# Patient Record
Sex: Male | Born: 1950
Health system: Southern US, Community
[De-identification: ages and names within clinical notes are randomized; demographics above are authoritative.]

## PROBLEM LIST (undated history)

## (undated) DIAGNOSIS — E559 Vitamin D deficiency, unspecified: Secondary | ICD-10-CM

## (undated) DIAGNOSIS — R635 Abnormal weight gain: Secondary | ICD-10-CM

## (undated) DIAGNOSIS — E7800 Pure hypercholesterolemia, unspecified: Secondary | ICD-10-CM

## (undated) DIAGNOSIS — Z79899 Other long term (current) drug therapy: Secondary | ICD-10-CM

## (undated) DIAGNOSIS — K219 Gastro-esophageal reflux disease without esophagitis: Secondary | ICD-10-CM

## (undated) DIAGNOSIS — I2510 Atherosclerotic heart disease of native coronary artery without angina pectoris: Secondary | ICD-10-CM

## (undated) DIAGNOSIS — I10 Essential (primary) hypertension: Secondary | ICD-10-CM

## (undated) DIAGNOSIS — S060XAA Concussion with loss of consciousness status unknown, initial encounter: Secondary | ICD-10-CM

## (undated) DIAGNOSIS — R011 Cardiac murmur, unspecified: Secondary | ICD-10-CM

## (undated) DIAGNOSIS — Z22322 Carrier or suspected carrier of Methicillin resistant Staphylococcus aureus: Secondary | ICD-10-CM

## (undated) DIAGNOSIS — E782 Mixed hyperlipidemia: Secondary | ICD-10-CM

## (undated) DIAGNOSIS — M1990 Unspecified osteoarthritis, unspecified site: Secondary | ICD-10-CM

## (undated) DIAGNOSIS — S060X9A Concussion with loss of consciousness of unspecified duration, initial encounter: Secondary | ICD-10-CM

## (undated) HISTORY — DX: Essential (primary) hypertension: I10

## (undated) HISTORY — PX: COLONOSCOPY W/ POLYPECTOMY: SHX1380

## (undated) HISTORY — DX: Mixed hyperlipidemia: E78.2

## (undated) HISTORY — DX: Other long term (current) drug therapy: Z79.899

## (undated) HISTORY — DX: Vitamin D deficiency, unspecified: E55.9

## (undated) HISTORY — DX: Pure hypercholesterolemia, unspecified: E78.00

## (undated) HISTORY — DX: Abnormal weight gain: R63.5

---
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
## 2000-07-27 HISTORY — PX: TOTAL HIP ARTHROPLASTY: SHX124

## 2010-07-27 HISTORY — PX: RHINOPLASTY: SUR1284

## 2011-07-28 HISTORY — PX: LAMINECTOMY: SHX219

## 2018-09-09 DIAGNOSIS — Z22322 Carrier or suspected carrier of Methicillin resistant Staphylococcus aureus: Secondary | ICD-10-CM

## 2018-09-09 HISTORY — DX: Carrier or suspected carrier of methicillin resistant Staphylococcus aureus: Z22.322

## 2018-09-25 HISTORY — PX: LAMINECTOMY: SHX219

## 2019-01-10 ENCOUNTER — Encounter: Payer: Self-pay | Admitting: Cardiovascular Disease

## 2019-04-02 ENCOUNTER — Encounter: Payer: Self-pay | Admitting: Cardiovascular Disease

## 2019-04-21 ENCOUNTER — Other Ambulatory Visit: Payer: Self-pay

## 2019-04-21 ENCOUNTER — Encounter: Payer: Self-pay | Admitting: Cardiovascular Disease

## 2019-04-21 ENCOUNTER — Ambulatory Visit (INDEPENDENT_AMBULATORY_CARE_PROVIDER_SITE_OTHER): Payer: BC Managed Care – PPO | Admitting: Cardiovascular Disease

## 2019-04-21 DIAGNOSIS — R9439 Abnormal result of other cardiovascular function study: Secondary | ICD-10-CM | POA: Diagnosis not present

## 2019-04-21 DIAGNOSIS — I10 Essential (primary) hypertension: Secondary | ICD-10-CM | POA: Diagnosis not present

## 2019-04-21 DIAGNOSIS — E782 Mixed hyperlipidemia: Secondary | ICD-10-CM

## 2019-04-21 DIAGNOSIS — E785 Hyperlipidemia, unspecified: Secondary | ICD-10-CM | POA: Insufficient documentation

## 2019-04-21 LAB — CBC
Hematocrit: 46.1 % (ref 37.5–51.0)
Hemoglobin: 15.6 g/dL (ref 13.0–17.7)
MCH: 30.6 pg (ref 26.6–33.0)
MCHC: 33.8 g/dL (ref 31.5–35.7)
MCV: 91 fL (ref 79–97)
Platelets: 240 10*3/uL (ref 150–450)
RBC: 5.09 x10E6/uL (ref 4.14–5.80)
RDW: 13.3 % (ref 11.6–15.4)
WBC: 6.6 10*3/uL (ref 3.4–10.8)

## 2019-04-21 LAB — BASIC METABOLIC PANEL
BUN/Creatinine Ratio: 16 (ref 10–24)
BUN: 19 mg/dL (ref 8–27)
CO2: 24 mmol/L (ref 20–29)
Calcium: 9.5 mg/dL (ref 8.6–10.2)
Chloride: 101 mmol/L (ref 96–106)
Creatinine, Ser: 1.16 mg/dL (ref 0.76–1.27)
GFR calc Af Amer: 75 mL/min/{1.73_m2} (ref >59)
GFR calc non Af Amer: 65 mL/min/{1.73_m2} (ref >59)
Glucose: 101 mg/dL — ABNORMAL HIGH (ref 65–99)
Potassium: 4.8 mmol/L (ref 3.5–5.2)
Sodium: 138 mmol/L (ref 134–144)

## 2019-04-21 LAB — TSH: TSH: 1.38 u[IU]/mL (ref 0.450–4.500)

## 2019-04-21 NOTE — Assessment & Plan Note (Signed)
History of hyperlipidemia on statin therapy with recent lipid profile performed by his PCP revealing total cholesterol 214, LDL 126 and HDL 47.

## 2019-04-21 NOTE — Patient Instructions (Addendum)
    [address] [place] [address] Dept: [phone] Loc: [place]  04/21/2019  You are scheduled for a Cardiac Catheterization on Monday, October 5 with Dr. Quay Burow.  1. Please arrive at the [facility] (Main Entrance A) at [facility]: [address] [place], [place] [address] at 11:30 AM (This time is two hours before your procedure to ensure your preparation). Free valet parking service is available.   Special note: Every effort is made to have your procedure done on time. Please understand that emergencies sometimes delay scheduled procedures.  2. Diet: Do not eat solid foods after midnight.  The patient may have clear liquids until 5am upon the day of the procedure.  3. Labs:  You will need to have blood drawn today. Go to:  [facility] at [facility] [address], [place], [place] [address] FOR YOUR BASIC METABOLIC PANEL, COMPLETE BLOOD COUNT, AND THYROID STIMULATING HORMONE LAB WORK. NO APPOINTMENT IS NEEDED. YOU MUST HAVE THIS LAB WORK DONE BEFORE GOING TO GET YOUR COVID-19 TEST DONE BECAUSE YOU WILL NEED TO SELF-ISOLATE AFTER THE COVID-19 TEST UNTIL THE DAY OF YOUR [place].  You will need a COVID-19 on 04/27/2019. Your appointment is at 1:00pm. Go to: [facility] Entr[facility] [place],  [address] FOR YOUR COVID-19 TEST. YOU MUST HAVE YOUR COVID-19 TEST COMPLETED 4 DAYS PRIOR TO YOUR UPCOMING PROCEDURE/TEST. YOU WILL ALSO NEED TO SELF-ISOLATE AFTER THE COVID-19 TEST UNTIL THE DAY OF YOUR PROCEDURE/TEST. PLEASE BRING YOUR I.D. AND YOUR INSURANCE CARD(S) WITH YOU.   4. Medication instructions in preparation for your procedure:   On the morning of your procedure, take your Aspirin and any morning medicines NOT listed above.  You may use sips of water.  5. Plan for one night  stay--bring personal belongings. 6. Bring a current list of your medications and current insurance cards. 7. You MUST have a responsible person to drive you home. 8. Someone MUST be with you the first 24 hours after you arrive home or your discharge will be delayed. 9. Please wear clothes that are easy to get on and off and wear slip-on shoes.  _____________________________________________________________________  Follow-Up: At [facility], you and your health needs are our priority.  As part of our continuing mission to provide you with exceptional heart care, we have created designated Provider Care Teams.  These Care Teams include your primary Cardiologist (physician) and Advanced Practice Providers (APPs -  Physician Assistants and Nurse Practitioners) who all work together to provide you with the care you need, when you need it. You will need a follow up appointment with Dr. Quay Burow after your pricedure. To be scheduled

## 2019-04-21 NOTE — Assessment & Plan Note (Signed)
Donald Hendrix is a 68 year old moderately overweight married Caucasian male sent to me by Dr. Claudie Leach for diagnostic coronary angiography because of chest pain and it positive Myoview stress test.  He has had exertional chest pain for years which is become more noticeable recently with long recovery time.  He had a Myoview stress test performed 03/30/2019 revealing inferior and apical ischemia.  2D echo was essentially normal with mild aortic sclerosis.  We will arrange for him to undergo left heart cath via the right radial approach.   I have reviewed the risks, indications, and alternatives to cardiac catheterization, possible angioplasty, and stenting with the patient. Risks include but are not limited to bleeding, infection, vascular injury, stroke, myocardial infection, arrhythmia, kidney injury, radiation-related injury in the case of prolonged fluoroscopy use, emergency cardiac surgery, and death. The patient understands the risks of serious complication is 1-2 in 1191 with diagnostic cardiac cath and 1-2% or less with angioplasty/stenting.

## 2019-04-21 NOTE — Assessment & Plan Note (Addendum)
History of essential hypertension with blood pressure measured today at 140/80.  He is on metoprolol.

## 2019-04-21 NOTE — H&P (View-Only) (Signed)
04/21/2019 Donald Hendrix   1950-08-26  [id number]  Primary Physician Franchot Mimes, MD Primary Cardiologist: Lorretta Harp MD [name], [place]  HPI:  Donald Hendrix is a 68 y.o. 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  He was referred by Dr. Claudie Leach for left heart cath because of chest pain and an abnormal Myoview stress test.  He does have a history of treated hypertension hyperlipidemia.  He had a 2D echo cardiogram performed 01/12/2019 that was essentially normal with aortic sclerosis and a Myoview stress test performed 03/30/2019 that showed inferior and apical ischemia.  He is had several years of exertional chest pain especially while mowing his lawn which is taken longer to resolve recently.   Current Meds  Medication Sig  . aspirin EC 81 MG tablet Take 81 mg by mouth daily.  . colchicine 0.6 MG tablet Take 0.6 mg by mouth as needed.   Marland Kitchen esomeprazole (NEXIUM) 40 MG capsule Take 40 mg by mouth daily at 12 noon.  . etodolac (LODINE) 400 MG tablet Take 400 mg by mouth 2 (two) times daily.  . fluticasone (FLONASE) 50 MCG/ACT nasal spray Place 2 sprays into both nostrils daily.  . metoprolol succinate (TOPROL-XL) 25 MG 24 hr tablet Take 25 mg by mouth daily.  . metoprolol succinate (TOPROL-XL) 50 MG 24 hr tablet Take 50 mg by mouth daily. Take with or immediately following a meal.  . Multiple Vitamin (MULTI-VITAMIN DAILY PO) Take 1 tablet by mouth daily.  . nitroGLYCERIN (NITROSTAT) 0.4 MG SL tablet Place 0.4 mg under the tongue every 5 (five) minutes as needed for chest pain.  . Omega-3 Fatty Acids (FISH OIL) 1000 MG CAPS Take 1 capsule by mouth daily.  Marland Kitchen PARoxetine (PAXIL) 10 MG tablet Take 10 mg by mouth daily.  . pravastatin (PRAVACHOL) 20 MG tablet Take 20 mg by mouth daily.     Allergies  Allergen Reactions  . Cephalexin Hives  . Dexamethasone Sodium Phosphate   . Bactrim  [Sulfamethoxazole-Trimethoprim] Rash    Social History   Socioeconomic History  . Marital status: Married    Spouse name: Not on file  . Number of children: Not on file  . Years of education: Not on file  . Highest education level: Not on file  Occupational History  . Not on file  Social Needs  . Financial resource strain: Not on file  . Food insecurity    Worry: Not on file    Inability: Not on file  . Transportation needs    Medical: Not on file    Non-medical: Not on file  Tobacco Use  . Smoking status: Never Smoker  . Smokeless tobacco: Never Used  Substance and Sexual Activity  . Alcohol use: Not on file  . Drug use: Not on file  . Sexual activity: Not on file  Lifestyle  . Physical activity    Days per week: Not on file    Minutes per session: Not on file  . Stress: Not on file  Relationships  . Social Herbalist on phone: Not on file    Gets together: Not on file    Attends religious service: Not on file    Active member of club or organization: Not on file    Attends meetings of clubs or organizations: Not on file    Relationship status: Not on file  .  Intimate partner violence    Fear of current or ex partner: Not on file    Emotionally abused: Not on file    Physically abused: Not on file    Forced sexual activity: Not on file  Other Topics Concern  . Not on file  Social History Narrative  . Not on file     Review of Systems: General: negative for chills, fever, night sweats or weight changes.  Cardiovascular: negative for chest pain, dyspnea on exertion, edema, orthopnea, palpitations, paroxysmal nocturnal dyspnea or shortness of breath Dermatological: negative for rash Respiratory: negative for cough or wheezing Urologic: negative for hematuria Abdominal: negative for nausea, vomiting, diarrhea, bright red blood per rectum, melena, or hematemesis Neurologic: negative for visual changes, syncope, or dizziness All other systems reviewed  and are otherwise negative except as noted above.    Blood pressure 140/80, pulse 66, height 6' (1.829 m), weight 255 lb (115.7 kg).  General appearance: alert and no distress Neck: no adenopathy, no carotid bruit, no JVD, supple, symmetrical, trachea midline and thyroid not enlarged, symmetric, no tenderness/mass/nodules Lungs: clear to auscultation bilaterally Heart: Soft outflow tract murmur Extremities: extremities normal, atraumatic, no cyanosis or edema Pulses: 2+ and symmetric Skin: Skin color, texture, turgor normal. No rashes or lesions Neurologic: Alert and oriented X 3, normal strength and tone. Normal symmetric reflexes. Normal coordination and gait  EKG sinus rhythm at 66 without ST or T wave changes.  I personally reviewed this EKG.  ASSESSMENT AND PLAN:   Abnormal nuclear stress test Donald Hendrix is a 68-year-old moderately overweight married Caucasian male sent to me by Dr. Rosario for diagnostic coronary angiography because of chest pain and it positive Myoview stress test.  He has had exertional chest pain for years which is become more noticeable recently with long recovery time.  He had a Myoview stress test performed 03/30/2019 revealing inferior and apical ischemia.  2D echo was essentially normal with mild aortic sclerosis.  We will arrange for him to undergo left heart cath via the right radial approach.   I have reviewed the risks, indications, and alternatives to cardiac catheterization, possible angioplasty, and stenting with the patient. Risks include but are not limited to bleeding, infection, vascular injury, stroke, myocardial infection, arrhythmia, kidney injury, radiation-related injury in the case of prolonged fluoroscopy use, emergency cardiac surgery, and death. The patient understands the risks of serious complication is 1-2 in 1000 with diagnostic cardiac cath and 1-2% or less with angioplasty/stenting.   Hyperlipidemia History of hyperlipidemia on statin  therapy with recent lipid profile performed by his PCP revealing total cholesterol 214, LDL 126 and HDL 47.  Essential hypertension History of essential hypertension with blood pressure measured today at.  He is on metoprolol.      Keilyn Nadal J. Chinaza Rooke MD FACP,FACC,FAHA, FSCAI 04/21/2019 11:24 AM 

## 2019-04-21 NOTE — Progress Notes (Signed)
04/21/2019 Donald Hendrix   1950-08-26  [id number]  Primary Physician Franchot Mimes, MD Primary Cardiologist: Lorretta Harp MD [name], [place]  HPI:  Donald Hendrix is a 68 y.o. 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  He was referred by Dr. Claudie Leach for left heart cath because of chest pain and an abnormal Myoview stress test.  He does have a history of treated hypertension hyperlipidemia.  He had a 2D echo cardiogram performed 01/12/2019 that was essentially normal with aortic sclerosis and a Myoview stress test performed 03/30/2019 that showed inferior and apical ischemia.  He is had several years of exertional chest pain especially while mowing his lawn which is taken longer to resolve recently.   Current Meds  Medication Sig  . aspirin EC 81 MG tablet Take 81 mg by mouth daily.  . colchicine 0.6 MG tablet Take 0.6 mg by mouth as needed.   Marland Kitchen esomeprazole (NEXIUM) 40 MG capsule Take 40 mg by mouth daily at 12 noon.  . etodolac (LODINE) 400 MG tablet Take 400 mg by mouth 2 (two) times daily.  . fluticasone (FLONASE) 50 MCG/ACT nasal spray Place 2 sprays into both nostrils daily.  . metoprolol succinate (TOPROL-XL) 25 MG 24 hr tablet Take 25 mg by mouth daily.  . metoprolol succinate (TOPROL-XL) 50 MG 24 hr tablet Take 50 mg by mouth daily. Take with or immediately following a meal.  . Multiple Vitamin (MULTI-VITAMIN DAILY PO) Take 1 tablet by mouth daily.  . nitroGLYCERIN (NITROSTAT) 0.4 MG SL tablet Place 0.4 mg under the tongue every 5 (five) minutes as needed for chest pain.  . Omega-3 Fatty Acids (FISH OIL) 1000 MG CAPS Take 1 capsule by mouth daily.  Marland Kitchen PARoxetine (PAXIL) 10 MG tablet Take 10 mg by mouth daily.  . pravastatin (PRAVACHOL) 20 MG tablet Take 20 mg by mouth daily.     Allergies  Allergen Reactions  . Cephalexin Hives  . Dexamethasone Sodium Phosphate   . Bactrim  [Sulfamethoxazole-Trimethoprim] Rash    Social History   Socioeconomic History  . Marital status: Married    Spouse name: Not on file  . Number of children: Not on file  . Years of education: Not on file  . Highest education level: Not on file  Occupational History  . Not on file  Social Needs  . Financial resource strain: Not on file  . Food insecurity    Worry: Not on file    Inability: Not on file  . Transportation needs    Medical: Not on file    Non-medical: Not on file  Tobacco Use  . Smoking status: Never Smoker  . Smokeless tobacco: Never Used  Substance and Sexual Activity  . Alcohol use: Not on file  . Drug use: Not on file  . Sexual activity: Not on file  Lifestyle  . Physical activity    Days per week: Not on file    Minutes per session: Not on file  . Stress: Not on file  Relationships  . Social Herbalist on phone: Not on file    Gets together: Not on file    Attends religious service: Not on file    Active member of club or organization: Not on file    Attends meetings of clubs or organizations: Not on file    Relationship status: Not on file  .  Intimate partner violence    Fear of current or ex partner: Not on file    Emotionally abused: Not on file    Physically abused: Not on file    Forced sexual activity: Not on file  Other Topics Concern  . Not on file  Social History Narrative  . Not on file     Review of Systems: General: negative for chills, fever, night sweats or weight changes.  Cardiovascular: negative for chest pain, dyspnea on exertion, edema, orthopnea, palpitations, paroxysmal nocturnal dyspnea or shortness of breath Dermatological: negative for rash Respiratory: negative for cough or wheezing Urologic: negative for hematuria Abdominal: negative for nausea, vomiting, diarrhea, bright red blood per rectum, melena, or hematemesis Neurologic: negative for visual changes, syncope, or dizziness All other systems reviewed  and are otherwise negative except as noted above.    Blood pressure 140/80, pulse 66, height 6' (1.829 m), weight 255 lb (115.7 kg).  General appearance: alert and no distress Neck: no adenopathy, no carotid bruit, no JVD, supple, symmetrical, trachea midline and thyroid not enlarged, symmetric, no tenderness/mass/nodules Lungs: clear to auscultation bilaterally Heart: Soft outflow tract murmur Extremities: extremities normal, atraumatic, no cyanosis or edema Pulses: 2+ and symmetric Skin: Skin color, texture, turgor normal. No rashes or lesions Neurologic: Alert and oriented X 3, normal strength and tone. Normal symmetric reflexes. Normal coordination and gait  EKG sinus rhythm at 66 without ST or T wave changes.  I personally reviewed this EKG.  ASSESSMENT AND PLAN:   Abnormal nuclear stress test Donald Hendrix is a 68 year old moderately overweight married Caucasian male sent to me by Dr. Hanley Hays for diagnostic coronary angiography because of chest pain and it positive Myoview stress test.  He has had exertional chest pain for years which is become more noticeable recently with long recovery time.  He had a Myoview stress test performed 03/30/2019 revealing inferior and apical ischemia.  2D echo was essentially normal with mild aortic sclerosis.  We will arrange for him to undergo left heart cath via the right radial approach.   I have reviewed the risks, indications, and alternatives to cardiac catheterization, possible angioplasty, and stenting with the patient. Risks include but are not limited to bleeding, infection, vascular injury, stroke, myocardial infection, arrhythmia, kidney injury, radiation-related injury in the case of prolonged fluoroscopy use, emergency cardiac surgery, and death. The patient understands the risks of serious complication is 1-2 in 1000 with diagnostic cardiac cath and 1-2% or less with angioplasty/stenting.   Hyperlipidemia History of hyperlipidemia on statin  therapy with recent lipid profile performed by his PCP revealing total cholesterol 214, LDL 126 and HDL 47.  Essential hypertension History of essential hypertension with blood pressure measured today at.  He is on metoprolol.      Runell Gess MD FACP,FACC,FAHA, [facility] 04/21/2019 11:24 AM

## 2019-04-24 ENCOUNTER — Encounter: Payer: Self-pay | Admitting: *Deleted

## 2019-04-27 ENCOUNTER — Inpatient Hospital Stay (HOSPITAL_COMMUNITY): Admission: RE | Admit: 2019-04-27 | Payer: BC Managed Care – PPO | Source: Ambulatory Visit

## 2019-04-27 ENCOUNTER — Telehealth: Payer: Self-pay | Admitting: *Deleted

## 2019-04-27 ENCOUNTER — Other Ambulatory Visit: Payer: Self-pay | Admitting: Emergency Medicine

## 2019-04-27 DIAGNOSIS — Z20822 Contact with and (suspected) exposure to covid-19: Secondary | ICD-10-CM

## 2019-04-27 NOTE — Telephone Encounter (Addendum)
Pt contacted pre-catheterization scheduled at [facility] for: Monday May 01, 2019 1:30 PM Verified arrival time and place: [facility]) at: 11:30 AM   No solid food after midnight prior to cath, clear liquids until 5 AM day of procedure. Contrast allergy: no   AM meds can be  taken pre-cath with sip of water including: ASA 81 mg   Confirmed patient has responsible person to drive home post procedure and observe 24 hours after arriving home: yes  Currently, due to Covid-19 pandemic, only one support person will be allowed with patient. Must be the same support person for that patient's entire stay, will be screened and required to wear a mask. They will be asked to wait in the waiting room for the duration of the patient's stay.  Patients are required to wear a mask when they enter the hospital.      COVID-19 Pre-Screening Questions:  . In the past 7 to 10 days have you had a cough,  shortness of breath, headache, congestion, fever (100 or greater) body aches, chills, sore throat, or sudden loss of taste or sense of smell? no . Have you been around anyone with known Covid 19? no . Have you been around anyone who is awaiting Covid 19 test results in the past 7 to 10 days? no . Have you been around anyone who has been exposed to Covid 19, or has mentioned symptoms of Covid 19 within the past 7 to 10 days? no  I reviewed procedure/mask/visitor instructions, Covid-19 screening questions with patient, he verbalized understanding, thanked me for call.

## 2019-04-28 LAB — NOVEL CORONAVIRUS, NAA: SARS-CoV-2, NAA: NOT DETECTED

## 2019-05-01 ENCOUNTER — Ambulatory Visit (HOSPITAL_COMMUNITY)
Admission: RE | Admit: 2019-05-01 | Discharge: 2019-05-02 | Disposition: A | Discharge status: Home / Self Care | Payer: BC Managed Care – PPO | Attending: Cardiovascular Disease | Admitting: Cardiovascular Disease

## 2019-05-01 ENCOUNTER — Other Ambulatory Visit: Payer: Self-pay

## 2019-05-01 ENCOUNTER — Encounter (HOSPITAL_COMMUNITY)
Admission: RE | Disposition: A | Discharge status: Home / Self Care | Payer: BC Managed Care – PPO | Source: Home / Self Care | Attending: Cardiovascular Disease

## 2019-05-01 ENCOUNTER — Other Ambulatory Visit: Payer: Self-pay | Admitting: *Deleted

## 2019-05-01 DIAGNOSIS — Z6838 Body mass index (BMI) 38.0-38.9, adult: Secondary | ICD-10-CM | POA: Diagnosis not present

## 2019-05-01 DIAGNOSIS — I25118 Atherosclerotic heart disease of native coronary artery with other forms of angina pectoris: Secondary | ICD-10-CM | POA: Insufficient documentation

## 2019-05-01 DIAGNOSIS — I700 Atherosclerosis of aorta: Secondary | ICD-10-CM | POA: Diagnosis not present

## 2019-05-01 DIAGNOSIS — R9439 Abnormal result of other cardiovascular function study: Secondary | ICD-10-CM

## 2019-05-01 DIAGNOSIS — I2510 Atherosclerotic heart disease of native coronary artery without angina pectoris: Secondary | ICD-10-CM

## 2019-05-01 DIAGNOSIS — E663 Overweight: Secondary | ICD-10-CM | POA: Diagnosis not present

## 2019-05-01 DIAGNOSIS — Z79899 Other long term (current) drug therapy: Secondary | ICD-10-CM | POA: Insufficient documentation

## 2019-05-01 DIAGNOSIS — I10 Essential (primary) hypertension: Secondary | ICD-10-CM | POA: Diagnosis present

## 2019-05-01 DIAGNOSIS — I739 Peripheral vascular disease, unspecified: Secondary | ICD-10-CM | POA: Diagnosis not present

## 2019-05-01 DIAGNOSIS — Z881 Allergy status to other antibiotic agents status: Secondary | ICD-10-CM | POA: Diagnosis not present

## 2019-05-01 DIAGNOSIS — I25110 Atherosclerotic heart disease of native coronary artery with unstable angina pectoris: Secondary | ICD-10-CM

## 2019-05-01 DIAGNOSIS — E785 Hyperlipidemia, unspecified: Secondary | ICD-10-CM | POA: Diagnosis not present

## 2019-05-01 DIAGNOSIS — Z7982 Long term (current) use of aspirin: Secondary | ICD-10-CM | POA: Diagnosis not present

## 2019-05-01 HISTORY — PX: LEFT HEART CATH AND CORONARY ANGIOGRAPHY: CATH118249

## 2019-05-01 SURGERY — LEFT HEART CATH AND CORONARY ANGIOGRAPHY
Anesthesia: LOCAL

## 2019-05-01 MED ORDER — COLCHICINE 0.6 MG PO TABS: 0.6000 mg | ORAL_TABLET | Freq: Every day | ORAL | Status: DC | PRN

## 2019-05-01 MED ORDER — SODIUM CHLORIDE 0.9% FLUSH: 3.0000 mL | Freq: Two times a day (BID) | INTRAVENOUS | Status: DC

## 2019-05-01 MED ORDER — LISINOPRIL 5 MG PO TABS
5.0000 mg | ORAL_TABLET | Freq: Every day | ORAL | Status: DC
  Administered 2019-05-01 – 2019-05-02 (×2): 5 mg via ORAL
  Filled 2019-05-01 (×2): qty 1

## 2019-05-01 MED ORDER — SODIUM CHLORIDE 0.9 % WEIGHT BASED INFUSION
3.0000 mL/kg/h | INTRAVENOUS | Status: DC
  Administered 2019-05-01: 12:00:00 3 mL/kg/h via INTRAVENOUS

## 2019-05-01 MED ORDER — NITROGLYCERIN 0.4 MG SL SUBL: 0.4000 mg | SUBLINGUAL_TABLET | SUBLINGUAL | Status: DC | PRN

## 2019-05-01 MED ORDER — ASPIRIN EC 81 MG PO TBEC: 81.0000 mg | DELAYED_RELEASE_TABLET | Freq: Every day | ORAL | Status: DC

## 2019-05-01 MED ORDER — HYDRALAZINE HCL 20 MG/ML IJ SOLN
INTRAMUSCULAR | Status: AC
  Filled 2019-05-01: qty 1

## 2019-05-01 MED ORDER — METOPROLOL SUCCINATE ER 25 MG PO TB24
25.0000 mg | ORAL_TABLET | Freq: Every day | ORAL | Status: DC
  Administered 2019-05-01 – 2019-05-02 (×2): 25 mg via ORAL
  Filled 2019-05-01 (×2): qty 1

## 2019-05-01 MED ORDER — LABETALOL HCL 5 MG/ML IV SOLN: 10.0000 mg | INTRAVENOUS | Status: DC | PRN

## 2019-05-01 MED ORDER — LIDOCAINE HCL (PF) 1 % IJ SOLN
INTRAMUSCULAR | Status: AC
  Filled 2019-05-01: qty 30

## 2019-05-01 MED ORDER — LIDOCAINE HCL (PF) 1 % IJ SOLN
INTRAMUSCULAR | Status: DC | PRN
  Administered 2019-05-01: 2 mL

## 2019-05-01 MED ORDER — HEPARIN SODIUM (PORCINE) 1000 UNIT/ML IJ SOLN
INTRAMUSCULAR | Status: AC
  Filled 2019-05-01: qty 1

## 2019-05-01 MED ORDER — VERAPAMIL HCL 2.5 MG/ML IV SOLN
INTRAVENOUS | Status: DC | PRN
  Administered 2019-05-01: 15:00:00 10 mL via INTRA_ARTERIAL

## 2019-05-01 MED ORDER — SODIUM CHLORIDE 0.9 % WEIGHT BASED INFUSION: 1.0000 mL/kg/h | INTRAVENOUS | Status: DC

## 2019-05-01 MED ORDER — SODIUM CHLORIDE 0.9% FLUSH: 3.0000 mL | INTRAVENOUS | Status: DC | PRN

## 2019-05-01 MED ORDER — SODIUM CHLORIDE 0.9 % IV SOLN: INTRAVENOUS | Status: AC

## 2019-05-01 MED ORDER — MORPHINE SULFATE (PF) 2 MG/ML IV SOLN: 2.0000 mg | INTRAVENOUS | Status: DC | PRN

## 2019-05-01 MED ORDER — HEPARIN (PORCINE) IN NACL 1000-0.9 UT/500ML-% IV SOLN
INTRAVENOUS | Status: AC
  Filled 2019-05-01: qty 1000

## 2019-05-01 MED ORDER — PAROXETINE HCL 10 MG PO TABS
10.0000 mg | ORAL_TABLET | Freq: Every day | ORAL | Status: DC
  Administered 2019-05-02: 10:00:00 10 mg via ORAL
  Filled 2019-05-01: qty 1

## 2019-05-01 MED ORDER — HYDRALAZINE HCL 20 MG/ML IJ SOLN
INTRAMUSCULAR | Status: DC | PRN
  Administered 2019-05-01 (×2): 10 mg via INTRAVENOUS

## 2019-05-01 MED ORDER — ACETAMINOPHEN 325 MG PO TABS
650.0000 mg | ORAL_TABLET | ORAL | Status: DC | PRN
  Administered 2019-05-01: 22:00:00 650 mg via ORAL
  Filled 2019-05-01: qty 2

## 2019-05-01 MED ORDER — ASPIRIN EC 81 MG PO TBEC
81.0000 mg | DELAYED_RELEASE_TABLET | Freq: Every day | ORAL | Status: DC
  Administered 2019-05-02: 10:00:00 81 mg via ORAL
  Filled 2019-05-01: qty 1

## 2019-05-01 MED ORDER — ONDANSETRON HCL 4 MG/2ML IJ SOLN: 4.0000 mg | Freq: Four times a day (QID) | INTRAMUSCULAR | Status: DC | PRN

## 2019-05-01 MED ORDER — SODIUM CHLORIDE 0.9% FLUSH
3.0000 mL | Freq: Two times a day (BID) | INTRAVENOUS | Status: DC
  Administered 2019-05-02: 10:00:00 3 mL via INTRAVENOUS

## 2019-05-01 MED ORDER — SODIUM CHLORIDE 0.9 % IV SOLN: 250.0000 mL | INTRAVENOUS | Status: DC | PRN

## 2019-05-01 MED ORDER — ASPIRIN 81 MG PO CHEW: 81.0000 mg | CHEWABLE_TABLET | ORAL | Status: DC

## 2019-05-01 MED ORDER — HYDRALAZINE HCL 20 MG/ML IJ SOLN: 10.0000 mg | INTRAMUSCULAR | Status: AC | PRN

## 2019-05-01 MED ORDER — VERAPAMIL HCL 2.5 MG/ML IV SOLN
INTRAVENOUS | Status: AC
  Filled 2019-05-01: qty 2

## 2019-05-01 MED ORDER — HEPARIN SODIUM (PORCINE) 1000 UNIT/ML IJ SOLN
INTRAMUSCULAR | Status: DC | PRN
  Administered 2019-05-01: 5500 [IU] via INTRAVENOUS

## 2019-05-01 MED ORDER — HEPARIN (PORCINE) IN NACL 1000-0.9 UT/500ML-% IV SOLN
INTRAVENOUS | Status: DC | PRN
  Administered 2019-05-01: 500 mL

## 2019-05-01 MED ORDER — ACETAMINOPHEN 325 MG PO TABS: 650.0000 mg | ORAL_TABLET | ORAL | Status: DC | PRN

## 2019-05-01 MED ORDER — ASPIRIN 81 MG PO CHEW: 81.0000 mg | CHEWABLE_TABLET | Freq: Every day | ORAL | Status: DC

## 2019-05-01 MED ORDER — HYDRALAZINE HCL 20 MG/ML IJ SOLN: 5.0000 mg | INTRAMUSCULAR | Status: DC | PRN

## 2019-05-01 MED ORDER — IOHEXOL 350 MG/ML SOLN
INTRAVENOUS | Status: DC | PRN
  Administered 2019-05-01: 15:00:00 70 mL via INTRA_ARTERIAL

## 2019-05-01 MED ORDER — PRAVASTATIN SODIUM 10 MG PO TABS
20.0000 mg | ORAL_TABLET | Freq: Every day | ORAL | Status: DC
  Administered 2019-05-01: 22:00:00 20 mg via ORAL
  Filled 2019-05-01: qty 2

## 2019-05-01 SURGICAL SUPPLY — 12 items
CATH INFINITI 5FR ANG PIGTAIL (CATHETERS) ×1 IMPLANT
CATH INFINITI JR4 5F (CATHETERS) ×1 IMPLANT
CATH OPTITORQUE TIG 4.0 5F (CATHETERS) ×1 IMPLANT
DEVICE RAD COMP TR BAND LRG (VASCULAR PRODUCTS) ×1 IMPLANT
GLIDESHEATH SLEND A-KIT 6F 22G (SHEATH) ×1 IMPLANT
GUIDEWIRE INQWIRE 1.5J.035X260 (WIRE) IMPLANT
INQWIRE 1.5J .035X260CM (WIRE) ×2
KIT HEART LEFT (KITS) ×2 IMPLANT
PACK CARDIAC CATHETERIZATION (CUSTOM PROCEDURE TRAY) ×2 IMPLANT
TRANSDUCER W/STOPCOCK (MISCELLANEOUS) ×2 IMPLANT
TUBING CIL FLEX 10 FLL-RA (TUBING) ×2 IMPLANT
WIRE HITORQ VERSACORE ST 145CM (WIRE) ×1 IMPLANT

## 2019-05-01 NOTE — Interval H&P Note (Signed)
Cath Lab Visit (complete for each Cath Lab visit)  Clinical Evaluation Leading to the Procedure:   ACS: No.  Non-ACS:    Anginal Classification: CCS II  Anti-ischemic medical therapy: Minimal Therapy (1 class of medications)  Non-Invasive Test Results: Intermediate-risk stress test findings: cardiac mortality 1-3%/year  Prior CABG: No previous CABG      History and Physical Interval Note:  05/01/2019 2:30 PM  Donald Hendrix  has presented today for surgery, with the diagnosis of Chest Pain.  The various methods of treatment have been discussed with the patient and family. After consideration of risks, benefits and other options for treatment, the patient has consented to  Procedure(s): LEFT HEART CATH AND CORONARY ANGIOGRAPHY (N/A) as a surgical intervention.  The patient's history has been reviewed, patient examined, no change in status, stable for surgery.  I have reviewed the patient's chart and labs.  Questions were answered to the patient's satisfaction.     Quay Burow

## 2019-05-01 NOTE — Consult Note (Signed)
[facility] [address]       [place] [address]             [phone]        Donald Hendrix Winner Medical Record #[id number] Date of Birth: [birth year]  Referring: No ref. provider found Primary Care: Franchot Mimes, MD Primary Cardiologist:No primary care provider on file.  Chief Complaint:   No chief complaint on file. 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 Claudie Leach with complaints of chest pain during moderate exertion such as mowing the grass.  This was evaluated with a Myoview scan which was positive.  Patient was referred to Dr. Gwenlyn Found, performed left heart catheterization today demonstrating multivessel coronary artery disease.  This includes a 70% left main stenosis.  Patient is referred for coronary artery bypass grafting.Marland KitchenHe claims no chest pain at present but he is hypertensive.   Current Activity/ Functional Status: Patient is independent with mobility/ambulation, transfers, ADL's, IADL's.   Zubrod Score: At the time of surgery this patient's most appropriate activity status/level should be described as: []     0    Normal activity, no symptoms []     1    Restricted in physical strenuous activity but ambulatory, able to do out light work []     2    Ambulatory and capable of self care, unable to do work activities, up and about                 more than 50%  Of the time                            []     3    Only limited self care, in bed greater than 50% of waking hours []     4    Completely disabled, no self care, confined to bed or chair []     5    Moribund  Past Medical History:  Diagnosis Date  . Abnormal weight gain   . High risk medication use   . Hypercholesterolemia   . Hyperlipidemia, mixed   . Malignant hypertension   . Vitamin D deficiency     Past Surgical History:  Procedure Laterality Date  . TOTAL HIP ARTHROPLASTY  2002     Social History   Tobacco Use  Smoking Status Never Smoker  Smokeless Tobacco Never Used    Social History   Substance and Sexual Activity  Alcohol Use Not on file     Allergies  Allergen Reactions  . Cephalexin Hives  . Dexamethasone Sodium Phosphate   . Bactrim [Sulfamethoxazole-Trimethoprim] Rash    Current Facility-Administered Medications  Medication Dose Route Frequency Provider Last Rate Last Dose  . 0.9 %  sodium chloride infusion   Intravenous Continuous Lorretta Harp, MD      . sodium chloride flush (NS) 0.9 % injection 3 mL  3 mL Intravenous Q12H Lorretta Harp, MD        Medications Prior to Admission  Medication Sig Dispense Refill Last Dose  . aspirin EC 81 MG tablet Take 81 mg by mouth daily.   05/01/2019 at 0730  . esomeprazole (NEXIUM) 40 MG capsule Take 40 mg by mouth daily.    05/01/2019 at 0700  . etodolac (LODINE) 400 MG tablet Take 400 mg by mouth 2 (  two) times daily.   05/01/2019 at 0700  . lisinopril (ZESTRIL) 5 MG tablet Take 5 mg by mouth daily.   04/30/2019 at Unknown time  . metoprolol succinate (TOPROL-XL) 25 MG 24 hr tablet Take 25 mg by mouth daily.   04/30/2019 at Unknown time  . Multiple Vitamin (MULTI-VITAMIN DAILY PO) Take 1 tablet by mouth daily.   05/01/2019 at 0700  . nitroGLYCERIN (NITROSTAT) 0.4 MG SL tablet Place 0.4 mg under the tongue every 5 (five) minutes as needed for chest pain.   05/01/2019 at 0700  . PARoxetine (PAXIL) 10 MG tablet Take 10 mg by mouth daily.   05/01/2019 at 0700  . pravastatin (PRAVACHOL) 20 MG tablet Take 20 mg by mouth daily.   04/30/2019 at Unknown time  . colchicine 0.6 MG tablet Take 0.6 mg by mouth daily as needed (gout).    More than a month at Unknown time    No family history on file.   Review of Systems:   ROS A comprehensive review of systems was negative except for: Musculoskeletal: positive for History of back surgery.     Cardiac Review of Systems: Y or  [    ]= no  Chest Pain [    ]   Resting SOB [   ] Exertional SOB  [  ]  Orthopnea [  ]   Pedal Edema [   ]    Palpitations [  ] Syncope  [  ]   Presyncope [   ]  General Review of Systems: [Y] = yes [  ]=no Constitional: recent weight change [  ]; anorexia [  ]; fatigue [  ]; nausea [  ]; night sweats [  ]; fever [  ]; or chills [  ]                                                               Dental: Last Dentist visit:   Eye : blurred vision [  ]; diplopia [   ]; vision changes [  ];  Amaurosis fugax[  ]; Resp: cough [  ];  wheezing[  ];  hemoptysis[  ]; shortness of breath[  ]; paroxysmal nocturnal dyspnea[  ]; dyspnea on exertion[  ]; or orthopnea[  ];  GI:  gallstones[  ], vomiting[  ];  dysphagia[  ]; melena[  ];  hematochezia [  ]; heartburn[  ];   Hx of  Colonoscopy[  ]; GU: kidney stones [  ]; hematuria[  ];   dysuria [  ];  nocturia[  ];  history of     obstruction [  ]; urinary frequency [  ]             Skin: rash, swelling[  ];, hair loss[  ];  peripheral edema[  ];  or itching[  ]; Musculosketetal: myalgias[  ];  joint swelling[  ];  joint erythema[  ];  joint pain[  ];  back pain[  ];  Heme/Lymph: bruising[  ];  bleeding[  ];  anemia[  ];  Neuro: TIA[  ];  headaches[  ];  stroke[  ];  vertigo[  ];  seizures[  ];   paresthesias[  ];  difficulty walking[  ];  Psych:depression[  ]; anxiety[  ];  Endocrine: diabetes[  ];  thyroid dysfunction[  ];         Physical Exam: BP (!) 152/97   Pulse 72   Temp 98 F (36.7 C) (Skin)   Resp 17   Ht 6' (1.829 m)   Wt 113.4 kg   SpO2 98%   BMI 33.91 kg/m    General appearance: alert, cooperative and no distress Head: Normocephalic, without obvious abnormality, atraumatic Neck: no adenopathy, no carotid bruit, no JVD, supple, symmetrical, trachea midline and thyroid not enlarged, symmetric, no tenderness/mass/nodules Resp: clear to auscultation bilaterally Cardio: regular rate and rhythm, S1, S2 normal, no murmur, click, rub or gallop GI: soft, non-tender; bowel  sounds normal; no masses,  no organomegaly Extremities: extremities normal, atraumatic, no cyanosis or edema Neurologic: Grossly normal  Diagnostic Studies & Laboratory data:     Recent Radiology Findings:   No results found.   I have independently reviewed the left heart catheterization and agree with the interpretation.  I have discussed the results with the patient, understands the findings.  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  In addition he continues to work.  To date, his symptoms have been only upon exertion.  It is probably safe for him to be discharged and schedule CABG surgery as soon as possible.  We will try to get this scheduled at the end of next week we will accommodate his work schedule and family schedule.  Thank you for the consult and for allowing us to participate in this gentlemen's care.    I  spent 30 minutes counseling the patient face to face.   Donald Prestia Z. Vickey SagesAtkins, MD [phone] 05/01/2019 6:31 PM

## 2019-05-01 NOTE — Progress Notes (Signed)
Pt states that his wife has dementia and he plans to drive himself home after procedure. Informed him that he will not be able to drive for 24 hours. He then states he can call a friend.If he does not have a ride he will have to be admitted, Voices understanding.  Rennis Harding, Rn called and informed, just in case he will need to be admitted.

## 2019-05-01 NOTE — Progress Notes (Signed)
TCTS consulted for CABG evaluation. °

## 2019-05-02 ENCOUNTER — Ambulatory Visit (HOSPITAL_BASED_OUTPATIENT_CLINIC_OR_DEPARTMENT_OTHER): Payer: BC Managed Care – PPO

## 2019-05-02 ENCOUNTER — Encounter (HOSPITAL_COMMUNITY): Payer: Self-pay | Admitting: Cardiovascular Disease

## 2019-05-02 ENCOUNTER — Ambulatory Visit (HOSPITAL_COMMUNITY): Payer: BC Managed Care – PPO

## 2019-05-02 DIAGNOSIS — I739 Peripheral vascular disease, unspecified: Secondary | ICD-10-CM | POA: Diagnosis not present

## 2019-05-02 DIAGNOSIS — I25118 Atherosclerotic heart disease of native coronary artery with other forms of angina pectoris: Secondary | ICD-10-CM | POA: Diagnosis not present

## 2019-05-02 DIAGNOSIS — E782 Mixed hyperlipidemia: Secondary | ICD-10-CM

## 2019-05-02 DIAGNOSIS — I2510 Atherosclerotic heart disease of native coronary artery without angina pectoris: Secondary | ICD-10-CM

## 2019-05-02 DIAGNOSIS — Z01810 Encounter for preprocedural cardiovascular examination: Secondary | ICD-10-CM | POA: Diagnosis not present

## 2019-05-02 DIAGNOSIS — I10 Essential (primary) hypertension: Secondary | ICD-10-CM

## 2019-05-02 LAB — BASIC METABOLIC PANEL
Anion gap: 10 (ref 5–15)
BUN: 13 mg/dL (ref 8–23)
CO2: 26 mmol/L (ref 22–32)
Calcium: 8.8 mg/dL — ABNORMAL LOW (ref 8.9–10.3)
Chloride: 103 mmol/L (ref 98–111)
Creatinine, Ser: 1.32 mg/dL — ABNORMAL HIGH (ref 0.61–1.24)
GFR calc Af Amer: >60 mL/min (ref >60)
GFR calc non Af Amer: 56 mL/min — ABNORMAL LOW (ref >60)
Glucose, Bld: 102 mg/dL — ABNORMAL HIGH (ref 70–99)
Potassium: 4.1 mmol/L (ref 3.5–5.1)
Sodium: 139 mmol/L (ref 135–145)

## 2019-05-02 LAB — PULMONARY FUNCTION TEST
FEF 25-75 Pre: 2.4 L/sec
FEF2575-%Pred-Pre: 83 %
FEV1-%Pred-Pre: 78 %
FEV1-Pre: 2.89 L
FEV1FVC-%Pred-Pre: 102 %
FEV6-%Pred-Pre: 80 %
FEV6-Pre: 3.77 L
FEV6FVC-%Pred-Pre: 105 %
FVC-%Pred-Pre: 76 %
FVC-Pre: 3.77 L
Pre FEV1/FVC ratio: 77 %
Pre FEV6/FVC Ratio: 100 %

## 2019-05-02 LAB — CBC
HCT: 43.5 % (ref 39.0–52.0)
Hemoglobin: 14.8 g/dL (ref 13.0–17.0)
MCH: 30.4 pg (ref 26.0–34.0)
MCHC: 34 g/dL (ref 30.0–36.0)
MCV: 89.3 fL (ref 80.0–100.0)
Platelets: 227 10*3/uL (ref 150–400)
RBC: 4.87 MIL/uL (ref 4.22–5.81)
RDW: 13 % (ref 11.5–15.5)
WBC: 6.8 10*3/uL (ref 4.0–10.5)
nRBC: 0 % (ref 0.0–0.2)

## 2019-05-02 LAB — ECHOCARDIOGRAM COMPLETE
Height: 72 in
Weight: 4550.56 oz

## 2019-05-02 MED ORDER — ATORVASTATIN CALCIUM 80 MG PO TABS: 80.0000 mg | ORAL_TABLET | Freq: Every day | ORAL | 1 refills | Status: DC

## 2019-05-02 MED ORDER — ISOSORBIDE MONONITRATE ER 30 MG PO TB24: 30.0000 mg | ORAL_TABLET | Freq: Every day | ORAL | 1 refills | Status: DC

## 2019-05-02 MED ORDER — ATORVASTATIN CALCIUM 80 MG PO TABS: 80.0000 mg | ORAL_TABLET | Freq: Every day | ORAL | Status: DC

## 2019-05-02 MED ORDER — ISOSORBIDE MONONITRATE ER 30 MG PO TB24
30.0000 mg | ORAL_TABLET | Freq: Every day | ORAL | Status: DC
  Administered 2019-05-02: 10:00:00 30 mg via ORAL
  Filled 2019-05-02: qty 1

## 2019-05-02 NOTE — Progress Notes (Signed)
PreOp CABG examination complete. See CV Proc tab for preliminary results. Lita Mains- RDMS, RVT 2:11 PM  05/02/2019

## 2019-05-02 NOTE — Progress Notes (Signed)
  Echocardiogram 2D Echocardiogram has been performed.  Donald Hendrix 05/02/2019, 12:58 PM

## 2019-05-02 NOTE — Discharge Summary (Signed)
Discharge Summary    Patient ID: Donald Hendrix,  MRN: [id number], DOB/AGE: 1950-12-31 68 y.o.  Admit date: 05/01/2019 Discharge date: 05/02/2019  Primary Care Provider: Lawson Radar T Primary Cardiologist: No primary care provider on file.  Discharge Diagnoses    Principal Problem:   CAD in native artery Active Problems:   Hyperlipidemia   Essential hypertension   Claudication in peripheral vascular disease (HCC)   Allergies Allergies  Allergen Reactions  . Cephalexin Hives  . Dexamethasone Sodium Phosphate   . Bactrim [Sulfamethoxazole-Trimethoprim] Rash    Diagnostic Studies/Procedures    Cath: 05/01/19   Ost RCA to Prox RCA lesion is 100% stenosed.  Mid LM to Dist LM lesion is 70% stenosed.  Dist Cx lesion is 95% stenosed.  Prox Cx lesion is 60% stenosed.  1st Mrg lesion is 70% stenosed.  Prox LAD to Mid LAD lesion is 99% stenosed.  Mid LAD lesion is 80% stenosed.  IMPRESSION: Donald Hendrix has left main/three-vessel disease with an occluded codominant, 99% proximal LAD and high-grade distal PLA branch.  He has normal LV function with aortic sclerosis without stenosis.  He will need CABG.  He does not have a ride home today and we will keep him overnight and have the cardiothoracic surgeons evaluate him while he is here.  The radial sheath was removed and a TR band was placed on the right wrist to achieve patent hemostasis.  The patient left lab in stable condition.  Quay Burow. MD, [facility]  Diagnostic Dominance: Right   _____________   History of Present Illness     Donald Hendrix is a 68 y.o. 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  He was referred by Dr. Claudie Leach for left heart cath because of chest pain and an abnormal Myoview stress test.  He does have a history of treated hypertension hyperlipidemia.  He had a 2D echo cardiogram performed 01/12/2019 that was  essentially normal with aortic sclerosis and a Myoview stress test performed 03/30/2019 that showed inferior and apical ischemia.  He has had several years of exertional chest pain especially while mowing his lawn which is taken longer to resolve recently. Seen by Dr. Gwenlyn Found in the office and set up for outpatient cardiac cath.   Hospital Course   Consults: TCTS    Underwent cath noted above with significant 3v disease, 70% LM, 99% pLAD, occluded osital RCA, and 60% pLCx. He did not have a way home post cath and was keep overnight. Seen by TCTS, Dr. Orvan Seen with plans to bring back for outpatient CABG late next week. He was placed on ASA, statin switched to high dose atorvastatin 80mg  daily, and Imdur 30mg  added. Instructed on no strenuous activity while awaiting surgery. No chest pain post cath. Seen by cardiac rehab prior to discharge. Preop testing completed prior to discharge.  General: Well developed, well nourished, male appearing in no acute distress. Head: Normocephalic, atraumatic.  Neck: Supple without bruits, JVD. Lungs:  Resp regular and unlabored, CTA. Heart: RRR, S1, S2, + systolic murmur; no rub. Abdomen: Soft, non-tender, non-distended with normoactive bowel sounds. No hepatomegaly. No rebound/guarding. No obvious abdominal masses. Extremities: No clubbing, cyanosis, edema. Distal pedal pulses are 2+ bilaterally. Right radial cath site stable without bruising or hematoma Neuro: Alert and oriented X 3. Moves all extremities spontaneously. Psych: Normal affect.  [name] was seen by Dr. Martinique and determined stable for discharge  home. Follow up in the office has been arranged. Medications are listed below.   _____________  Discharge Vitals Blood pressure 132/90, pulse 61, temperature 98 F (36.7 C), temperature source Oral, resp. rate 17, height 6' (1.829 m), weight 129 kg, SpO2 98 %.  Filed Weights   05/01/19 1141 05/02/19 0336  Weight: 113.4 kg 129 kg    Labs &  Radiologic Studies    CBC Recent Labs    05/02/19 0328  WBC 6.8  HGB 14.8  HCT 43.5  MCV 89.3  PLT 227   Basic Metabolic Panel Recent Labs    16/10/96 0328  NA 139  K 4.1  CL 103  CO2 26  GLUCOSE 102*  BUN 13  CREATININE 1.32*  CALCIUM 8.8*   Liver Function Tests No results for input(s): AST, ALT, ALKPHOS, BILITOT, PROT, ALBUMIN in the last 72 hours. No results for input(s): LIPASE, AMYLASE in the last 72 hours. Cardiac Enzymes No results for input(s): CKTOTAL, CKMB, CKMBINDEX, TROPONINI in the last 72 hours. BNP Invalid input(s): POCBNP D-Dimer No results for input(s): DDIMER in the last 72 hours. Hemoglobin A1C No results for input(s): HGBA1C in the last 72 hours. Fasting Lipid Panel No results for input(s): CHOL, HDL, LDLCALC, TRIG, CHOLHDL, LDLDIRECT in the last 72 hours. Thyroid Function Tests No results for input(s): TSH, T4TOTAL, T3FREE, THYROIDAB in the last 72 hours.  Invalid input(s): FREET3 _____________  No results found. Disposition   Pt is being discharged home today in good condition.  Follow-up Plans & Appointments    Follow-up Information    Runell Gess, MD Follow up on 05/16/2019.   Specialties: Cardiology, Radiology Why: at 11:30am for your follow up appt. Contact information: [address] [place] [address] [phone]        Linden Dolin, MD Follow up.   Specialty: Cardiothoracic Surgery Why: Office will be calling you to arrange for your surgery next week.  Contact information: [address] [place] [address] [phone]          Discharge Instructions    Diet - low sodium heart healthy   Complete by: As directed    Discharge instructions   Complete by: As directed    Radial Site Care Refer to this sheet in the next few weeks. These instructions provide you with information on caring for yourself after your procedure. Your caregiver may also give you more specific  instructions. Your treatment has been planned according to current medical practices, but problems sometimes occur. Call your caregiver if you have any problems or questions after your procedure. HOME CARE INSTRUCTIONS You may shower the day after the procedure.Remove the bandage (dressing) and gently wash the site with plain soap and water.Gently pat the site dry.  Do not apply powder or lotion to the site.  Do not submerge the affected site in water for 3 to 5 days.  Inspect the site at least twice daily.  Do not flex or bend the affected arm for 24 hours.  No lifting over 5 pounds (2.3 kg) for 5 days after your procedure.  Do not drive home if you are discharged the same day of the procedure. Have someone else drive you.   What to expect: Any bruising will usually fade within 1 to 2 weeks.  Blood that collects in the tissue (hematoma) may be painful to the touch. It should usually decrease in size and tenderness within 1 to 2 weeks.  SEEK IMMEDIATE  MEDICAL CARE IF: You have unusual pain at the radial site.  You have redness, warmth, swelling, or pain at the radial site.  You have drainage (other than a small amount of blood on the dressing).  You have chills.  You have a fever or persistent symptoms for more than 72 hours.  You have a fever and your symptoms suddenly get worse.  Your arm becomes pale, cool, tingly, or numb.  You have heavy bleeding from the site. Hold pressure on the site.   Please no strenuous activity until your surgery. We have switched your statin, added aspirin and Imdur to your new medications.   Increase activity slowly   Complete by: As directed       Discharge Medications     Medication List    STOP taking these medications   pravastatin 20 MG tablet Commonly known as: PRAVACHOL     TAKE these medications   aspirin EC 81 MG tablet Take 81 mg by mouth daily.   atorvastatin 80 MG tablet Commonly known as: LIPITOR Take 1 tablet (80 mg total) by  mouth daily at 6 PM.   colchicine 0.6 MG tablet Take 0.6 mg by mouth daily as needed (gout).   esomeprazole 40 MG capsule Commonly known as: NEXIUM Take 40 mg by mouth daily.   etodolac 400 MG tablet Commonly known as: LODINE Take 400 mg by mouth 2 (two) times daily.   isosorbide mononitrate 30 MG 24 hr tablet Commonly known as: IMDUR Take 1 tablet (30 mg total) by mouth daily.   lisinopril 5 MG tablet Commonly known as: ZESTRIL Take 5 mg by mouth daily.   metoprolol succinate 25 MG 24 hr tablet Commonly known as: TOPROL-XL Take 25 mg by mouth daily.   MULTI-VITAMIN DAILY PO Take 1 tablet by mouth daily.   nitroGLYCERIN 0.4 MG SL tablet Commonly known as: NITROSTAT Place 0.4 mg under the tongue every 5 (five) minutes as needed for chest pain.   PARoxetine 10 MG tablet Commonly known as: PAXIL Take 10 mg by mouth daily.        Acute coronary syndrome (MI, NSTEMI, STEMI, etc) this admission?: No.     Outstanding Labs/Studies   Planned for outpatient CABG next week.   Duration of Discharge Encounter   Greater than 30 minutes including physician time.  Signed, Laverda PageLindsay Roberts NP-C 05/02/2019, 9:07 AM

## 2019-05-02 NOTE — Progress Notes (Signed)
Pt is feeling well. I encouraged him to ambulate before d/c. Discussed sternal precautions, IS (2300 mL), mobility post op, and d/c planning. Pt voiced understanding but sts his wife has mild dementia and he will be eager to get back home to her. I encouraged him to prioritize his recovery and maybe think of hiring someone to help with her. Gave pt materials to review. [place] CES, ACSM 8:49 AM 05/02/2019

## 2019-05-04 ENCOUNTER — Other Ambulatory Visit: Payer: Self-pay | Admitting: *Deleted

## 2019-05-04 DIAGNOSIS — I2510 Atherosclerotic heart disease of native coronary artery without angina pectoris: Secondary | ICD-10-CM

## 2019-05-08 NOTE — Pre-Procedure Instructions (Signed)
[id number] [address], [place] [address] Phone: [phone] Fax: [phone]      Your procedure is scheduled on Friday October 16th.  Report to [facility] Main Entrance "A" at 5:30 A.M., and check in at the Admitting office.  Call this number if you have problems the morning of surgery:  [phone]  Call [phone] if you have any questions prior to your surgery date Monday-Friday 8am-4pm    Remember:  Do not eat or drink after midnight the night before your surgery    Take these medicines the morning of surgery with A SIP OF WATER  esomeprazole (NEXIUM) isosorbide mononitrate (IMDUR) lisinopril (ZESTRIL) metoprolol succinate (TOPROL-XL) nitroGLYCERIN (NITROSTAT) PARoxetine (PAXIL)  Follow your surgeon's instructions on when to stop Asprin.  If no instructions were given by your surgeon then you will need to call the office to get those instructions.    As of today, STOP taking any Aspirin (unless otherwise instructed by your surgeon),etodolac (LODINE) , Aleve, Naproxen, Ibuprofen, Motrin, Advil, Goody's, BC's, all herbal medications, fish oil, and all vitamins.    The Morning of Surgery  Do not wear jewelry, make-up or nail polish.  Do not wear lotions, powders, or perfumes/colognes, or deodorant  Do not shave 48 hours prior to surgery.  Men may shave face and neck.  Do not bring valuables to the hospital.  [facility] is not responsible for any belongings or valuables.  If you are a smoker, DO NOT Smoke 24 hours prior to surgery IF you wear a CPAP at night please bring your mask, tubing, and machine the morning of surgery   Remember that you must have someone to transport you home after your surgery, and remain with you for 24 hours if you are discharged the same day.   Contacts, glasses, hearing aids, dentures or bridgework may not be worn into surgery.    Leave your suitcase in  the car.  After surgery it may be brought to your room.  For patients admitted to the hospital, discharge time will be determined by your treatment team.  Patients discharged the day of surgery will not be allowed to drive home.    Special instructions:   [facility]- Preparing For Surgery  Before surgery, you can play an important role. Because skin is not sterile, your skin needs to be as free of germs as possible. You can reduce the number of germs on your skin by washing with CHG (chlorahexidine gluconate) Soap before surgery.  CHG is an antiseptic cleaner which kills germs and bonds with the skin to continue killing germs even after washing.    Oral Hygiene is also important to reduce your risk of infection.  Remember - BRUSH YOUR TEETH THE MORNING OF SURGERY WITH YOUR REGULAR TOOTHPASTE  Please do not use if you have an allergy to CHG or antibacterial soaps. If your skin becomes reddened/irritated stop using the CHG.  Do not shave (including legs and underarms) for at least 48 hours prior to first CHG shower. It is OK to shave your face.  Please follow these instructions carefully.   1. Shower the NIGHT BEFORE SURGERY and the MORNING OF SURGERY with CHG Soap.   2. If you chose to wash your hair, wash your hair first as usual with your normal shampoo.  3. After you shampoo, rinse your hair and body thoroughly to remove the shampoo.  4. Use CHG as you  would any other liquid soap. You can apply CHG directly to the skin and wash gently with a scrungie or a clean washcloth.   5. Apply the CHG Soap to your body ONLY FROM THE NECK DOWN.  Do not use on open wounds or open sores. Avoid contact with your eyes, ears, mouth and genitals (private parts). Wash Face and genitals (private parts)  with your normal soap.   6. Wash thoroughly, paying special attention to the area where your surgery will be performed.  7. Thoroughly rinse your body with warm water from the neck down.  8. DO NOT  shower/wash with your normal soap after using and rinsing off the CHG Soap.  9. Pat yourself dry with a CLEAN TOWEL.  10. Wear CLEAN PAJAMAS to bed the night before surgery, wear comfortable clothes the morning of surgery  11. Place CLEAN SHEETS on your bed the night of your first shower and DO NOT SLEEP WITH PETS.    Day of Surgery:  Do not apply any deodorants/lotions. Please shower the morning of surgery with the CHG soap  Please wear clean clothes to the hospital/surgery center.   Remember to brush your teeth WITH YOUR REGULAR TOOTHPASTE.   Please read over the following fact sheets that you were given.

## 2019-05-09 ENCOUNTER — Other Ambulatory Visit: Payer: Self-pay

## 2019-05-09 ENCOUNTER — Ambulatory Visit (INDEPENDENT_AMBULATORY_CARE_PROVIDER_SITE_OTHER): Payer: BC Managed Care – PPO | Admitting: Thoracic Surgery (Cardiothoracic Vascular Surgery)

## 2019-05-09 ENCOUNTER — Encounter (HOSPITAL_COMMUNITY): Payer: Self-pay

## 2019-05-09 ENCOUNTER — Inpatient Hospital Stay (HOSPITAL_COMMUNITY): Admission: RE | Admit: 2019-05-09 | Payer: BC Managed Care – PPO | Source: Ambulatory Visit

## 2019-05-09 ENCOUNTER — Ambulatory Visit (HOSPITAL_COMMUNITY)
Admission: RE | Admit: 2019-05-09 | Discharge: 2019-05-09 | Disposition: A | Discharge status: Home / Self Care | Payer: BC Managed Care – PPO | Source: Ambulatory Visit | Attending: Thoracic Surgery (Cardiothoracic Vascular Surgery) | Admitting: Thoracic Surgery (Cardiothoracic Vascular Surgery)

## 2019-05-09 ENCOUNTER — Encounter: Payer: Self-pay | Admitting: Thoracic Surgery (Cardiothoracic Vascular Surgery)

## 2019-05-09 ENCOUNTER — Encounter (HOSPITAL_COMMUNITY)
Admission: RE | Admit: 2019-05-09 | Discharge: 2019-05-09 | Disposition: A | Discharge status: Home / Self Care | Payer: BC Managed Care – PPO | Source: Ambulatory Visit | Attending: Thoracic Surgery (Cardiothoracic Vascular Surgery) | Admitting: Thoracic Surgery (Cardiothoracic Vascular Surgery)

## 2019-05-09 ENCOUNTER — Encounter (HOSPITAL_COMMUNITY): Payer: Self-pay | Admitting: *Deleted

## 2019-05-09 VITALS — BP 156/89 | HR 65 | Temp 97.5°F | Resp 16 | Ht 72.0 in | Wt 284.0 lb

## 2019-05-09 DIAGNOSIS — I2510 Atherosclerotic heart disease of native coronary artery without angina pectoris: Secondary | ICD-10-CM | POA: Insufficient documentation

## 2019-05-09 LAB — URINALYSIS, ROUTINE W REFLEX MICROSCOPIC
Bilirubin Urine: NEGATIVE
Glucose, UA: NEGATIVE mg/dL
Hgb urine dipstick: NEGATIVE
Ketones, ur: NEGATIVE mg/dL
Leukocytes,Ua: NEGATIVE
Nitrite: NEGATIVE
Protein, ur: NEGATIVE mg/dL
Specific Gravity, Urine: 1.013 (ref 1.005–1.030)
pH: 5 (ref 5.0–8.0)

## 2019-05-09 LAB — CBC
HCT: 43 % (ref 39.0–52.0)
Hemoglobin: 15 g/dL (ref 13.0–17.0)
MCH: 31.3 pg (ref 26.0–34.0)
MCHC: 34.9 g/dL (ref 30.0–36.0)
MCV: 89.8 fL (ref 80.0–100.0)
Platelets: 235 10*3/uL (ref 150–400)
RBC: 4.79 MIL/uL (ref 4.22–5.81)
RDW: 12.5 % (ref 11.5–15.5)
WBC: 7.2 10*3/uL (ref 4.0–10.5)
nRBC: 0 % (ref 0.0–0.2)

## 2019-05-09 LAB — BLOOD GAS, ARTERIAL
Acid-Base Excess: 0.4 mmol/L (ref 0.0–2.0)
Bicarbonate: 24.3 mmol/L (ref 20.0–28.0)
Drawn by: 421801
FIO2: 0.21
O2 Saturation: 98.2 %
Patient temperature: 98.6
pCO2 arterial: 37.8 mmHg (ref 32.0–48.0)
pH, Arterial: 7.424 (ref 7.350–7.450)
pO2, Arterial: 106 mmHg (ref 83.0–108.0)

## 2019-05-09 LAB — COMPREHENSIVE METABOLIC PANEL
ALT: 39 U/L (ref 0–44)
AST: 36 U/L (ref 15–41)
Albumin: 4 g/dL (ref 3.5–5.0)
Alkaline Phosphatase: 41 U/L (ref 38–126)
Anion gap: 12 (ref 5–15)
BUN: 13 mg/dL (ref 8–23)
CO2: 21 mmol/L — ABNORMAL LOW (ref 22–32)
Calcium: 9 mg/dL (ref 8.9–10.3)
Chloride: 107 mmol/L (ref 98–111)
Creatinine, Ser: 1.11 mg/dL (ref 0.61–1.24)
GFR calc Af Amer: >60 mL/min (ref >60)
GFR calc non Af Amer: >60 mL/min (ref >60)
Glucose, Bld: 99 mg/dL (ref 70–99)
Potassium: 4.1 mmol/L (ref 3.5–5.1)
Sodium: 140 mmol/L (ref 135–145)
Total Bilirubin: 0.8 mg/dL (ref 0.3–1.2)
Total Protein: 6.7 g/dL (ref 6.5–8.1)

## 2019-05-09 LAB — HEMOGLOBIN A1C
Hgb A1c MFr Bld: 5.3 % (ref 4.8–5.6)
Mean Plasma Glucose: 105.41 mg/dL

## 2019-05-09 LAB — APTT: aPTT: 30 seconds (ref 24–36)

## 2019-05-09 LAB — PROTIME-INR
INR: 1 (ref 0.8–1.2)
Prothrombin Time: 13.5 seconds (ref 11.4–15.2)

## 2019-05-09 LAB — SURGICAL PCR SCREEN
MRSA, PCR: POSITIVE — AB
Staphylococcus aureus: POSITIVE — AB

## 2019-05-09 LAB — ABO/RH: ABO/RH(D): A POS

## 2019-05-09 NOTE — Pre-Procedure Instructions (Addendum)
Your procedure is scheduled on Friday October 16 th.  Report to [facility] Main Entrance "A" at 5:30 A.M., and check in at the Admitting office.            Your surgery or procedure is scheduled for 7:30 AM  Call this number if you have problems the morning of surgery:  [phone]  Call [phone] if you have any questions prior to your surgery date Monday-Friday 8am-4pm   Remember:  Do not eat or drink after midnight the night before your surgery.   Take these medicines the morning of surgery with A SIP OF WATER  esomeprazole (NEXIUM) isosorbide mononitrate (IMDUR) metoprolol succinate (TOPROL-XL) nitroGLYCERIN (NITROSTAT) PARoxetine (PAXIL)  Follow your surgeon's instructions on when to stop Asprin.  If no instructions were given by your surgeon then you will need to call the office to get those instructions.    As of today, STOP taking any Aspirin (unless otherwise instructed by your surgeon),etodolac (LODINE) , Aleve, Naproxen, Ibuprofen, Motrin, Advil, Goody's, BC's, all herbal medications, fish oil, and all vitamins.   Special instructions:   [facility]- Preparing For Surgery  Before surgery, you can play an important role. Because skin is not sterile, your skin needs to be as free of germs as possible. You can reduce the number of germs on your skin by washing with CHG (chlorahexidine gluconate) Soap before surgery.  CHG is an antiseptic cleaner which kills germs and bonds with the skin to continue killing germs even after washing.    Oral Hygiene is also important to reduce your risk of infection.  Remember - BRUSH YOUR TEETH THE MORNING OF SURGERY WITH YOUR REGULAR TOOTHPASTE  Please do not use if you have an allergy to CHG or antibacterial soaps. If your skin becomes reddened/irritated stop using the CHG.  Do not shave (including legs and underarms) for at least 48 hours prior to first CHG shower. It is OK to shave your face.  Please follow these instructions  carefully.   1. Shower the NIGHT BEFORE SURGERY and the MORNING OF SURGERY with CHG Soap.   2. If you chose to wash your hair, wash your hair first as usual with your normal shampoo.  3. After you shampoo,wash your face and private area with the soap you use at home, then rinse your hair and body thoroughly to remove the shampoo and soap.    4. Use CHG as you would any other liquid soap. You can apply CHG directly to the skin and wash gently with a scrungie or a clean washcloth.  5.   Apply the CHG Soap to your body ONLY FROM THE NECK DOWN.  Do not use on open wounds or open sores. Avoid contact with your eyes, ears, mouth and genitals (private parts).  6. Wash thoroughly, paying special attention to the area where your surgery will be performed.  7. Thoroughly rinse your body with warm water from the neck down.  8. DO NOT shower/wash with your normal soap after using and rinsing off the CHG Soap.  9. Pat yourself dry with a CLEAN TOWEL.  10. Wear CLEAN PAJAMAS to bed the night before surgery, wear comfortable clothes the morning of surgery  11. Place CLEAN SHEETS on your bed the night of your first shower and DO NOT SLEEP WITH PETS.  Day of Surgery: Shower as instructed above. Do not apply any deodorants/lotions, powders or colognes.. Please wear clean clothes to the hospital/surgery center.   Remember  to brush your teeth WITH YOUR REGULAR TOOTHPASTE.  The Morning of Surgery  Do not wear jewelry, make-up or nail polish.             Men may shave face and neck.  Contacts, glasses, hearing aids, dentures or bridgework may not be worn into surgery.   Leave your suitcase in the car.  After surgery it may be brought to your room.  For patients admitted to the hospital, discharge time will be determined by your treatment team.  Do not bring valuables to the hospital.  [facility] is not responsible for any belongings or valuables.  Please read over the following  fact sheets  that you were given.

## 2019-05-09 NOTE — H&P (View-Only) (Signed)
° °   °[address] °      [place] [address] °            [phone]   ° ° °HPI: Donald Hendrix returns for additional consultation regarding coronary artery disease. ° °Donald Hendrix is a 68-year-old gentleman with no prior history of coronary disease.  He does have a past medical history significant for hypertension and hyperlipidemia.  He also is overweight.  Over the past couple of years he has noted some chest tightness while mowing his lawn.  He usually does not experience it with walking only.  He recently noticed that is become more frequent, more severe, and last longer before resolving.  It has always resolved with rest. ° °He saw Dr. Rosario and a stress Myoview was done.  It showed inferior and apical ischemia.  An echocardiogram earlier had shown aortic sclerosis but no significant stenosis.  He was referred to Dr. Barry and underwent cardiac catheterization which revealed left main and three-vessel coronary disease.  He had a totally occluded right coronary and a diffusely diseased LAD. ° °He has not had any rest or nocturnal symptoms. ° °Past Medical History:  °Diagnosis Date  °• Abnormal weight gain   °• High risk medication use   °• Hypercholesterolemia   °• Hyperlipidemia, mixed   °• Malignant hypertension   °• Vitamin D deficiency   ° °Past Surgical History:  °Procedure Laterality Date  °• LEFT HEART CATH AND CORONARY ANGIOGRAPHY N/A 05/01/2019  ° Procedure: LEFT HEART CATH AND CORONARY ANGIOGRAPHY;  Surgeon: Berry, Jonathan J, MD;  Location: [facility] INVASIVE CV [facility];  Service: Cardiovascular;  Laterality: N/A;  °• TOTAL HIP ARTHROPLASTY  2002  ° ° °Current Outpatient Medications  °Medication Sig Dispense Refill  °• aspirin EC 81 MG tablet Take 81 mg by mouth daily.    °• atorvastatin (LIPITOR) 80 MG tablet Take 1 tablet (80 mg total) by mouth daily at 6 PM. 90 tablet 1  °• colchicine 0.6 MG tablet Take 0.6 mg by mouth daily as needed (gout).     °• esomeprazole (NEXIUM) 40 MG capsule Take 40  mg by mouth daily.     °• etodolac (LODINE) 400 MG tablet Take 400 mg by mouth 2 (two) times daily.    °• isosorbide mononitrate (IMDUR) 30 MG 24 hr tablet Take 1 tablet (30 mg total) by mouth daily. 30 tablet 1  °• lisinopril (ZESTRIL) 5 MG tablet Take 5 mg by mouth daily.    °• metoprolol succinate (TOPROL-XL) 25 MG 24 hr tablet Take 25 mg by mouth daily.    °• Multiple Vitamin (MULTI-VITAMIN DAILY PO) Take 1 tablet by mouth daily.    °• nitroGLYCERIN (NITROSTAT) 0.4 MG SL tablet Place 0.4 mg under the tongue every 5 (five) minutes as needed for chest pain.    °• PARoxetine (PAXIL) 10 MG tablet Take 10 mg by mouth daily.    ° °No current facility-administered medications for this visit.   ° ° °Physical Exam °BP (!) 156/89 (BP Location: Right Arm, Patient Position: Sitting, Cuff Size: Normal)    Pulse 65    Temp (!) 97.5 °F (36.4 °C)    Resp 16    Ht 6' (1.829 m)    Wt 284 lb (128.8 kg)    SpO2 96% Comment: RA   BMI 38.52 kg/m²  °68-year-old man in no acute distress °Alert and oriented x3 with no focal deficits °No carotid bruits °Cardiac regular rate and rhythm with   a 2/6 systolic murmur at the right upper sternal border Lungs clear with equal breath sounds bilaterally Normal Allen's test left arm Pulses intact Abdomen soft nontender No peripheral edema.  Diagnostic Tests: Cardiac catheterization Conclusion    Ost RCA to Prox RCA lesion is 100% stenosed.  Mid LM to Dist LM lesion is 70% stenosed.  Dist Cx lesion is 95% stenosed.  Prox Cx lesion is 60% stenosed.  1st Mrg lesion is 70% stenosed.  Prox LAD to Mid LAD lesion is 99% stenosed.  Mid LAD lesion is 80% stenosed.   I personally reviewed the catheterization images and concur with the findings noted above  Impression: Donald Hendrix is a 68 year old man with a past medical history significant for hypertension, hyperlipidemia, gout, and being overweight.  He has no history of tobacco abuse.  He does have a family history with a  sister having had cardiac interventions.  He has about a 2-year history of exertional chest tightness.  This is become more frequent, severe, and longer lasting over the past couple of months.  That led to a cardiac work-up which revealed left main and three-vessel coronary disease.  He has normal left ventricular function by echocardiogram.  Coronary bypass grafting is indicated for survival benefit and relief of symptoms.  I discussed the general nature of the operation with Donald Hendrix.  I informed him of the incisions to be used, the need for general anesthesia, the use of cardiopulmonary bypass, the use of drainage tubes and pacing wires postoperatively, the expected hospital stay, and the overall recovery.  I informed him of the indications, risk, benefits, and alternatives.  He understands the risks include, but not limited to death, MI, stroke, DVT, PE, bleeding, possible need for transfusion, infection, cardiac arrhythmias, respiratory or renal failure, as well as the possibility of other unforeseeable complications.  He has a normal Allen's test on the left.  I think his left radial would be a good bypass with a distal OM which has a tight stenosis.  We will plan to use it at the time of surgery.  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  He is very interested in getting home as quickly as possible.  I reassured him that we would discharge him as soon as it was medically safe.  He does understand he will have some limitations when he first gets home.  Hypertension-blood pressure elevated.  Will work on that in the postoperative period  Plan: Coronary artery bypass grafting using left radial artery on Friday, 05/12/2019  [name], MD [facility] [phone]

## 2019-05-09 NOTE — Progress Notes (Addendum)
PCP -  Donald Dandy, PA-C  [facility]  Cardiologist - Dr Sharolyn Douglas  Chest x-ray - 05/09/2019  EKG - 04/21/2019  Stress Test - 03/30/2019  ECHO - 05/02/2019  Cardiac Cath - 05/01/2019  Sleep Study - no CPAP - no  LABS-05/01/2019  ASA- last dose 05/11/2019  ERAS-no  HA1C-5.3 Fasting Blood Sugar - na Checks Blood Sugar __na___ times a day  Anaesthesia- Donald Hendrix has had mid sternal chest pain for 2 years, usually happens when is is mowing the yard.  Pain became with less strenous activity, he mentioned to PCP and was sent for stress test, which was abnormal and then sent to Dr Gwenlyn Found. Donald Matera denies shortness of breath, lightheadedness or diaphoresis while having chest pain.  Patient reports that he had mid sternal chest pain last night as he was lifting 3 small dogs to the bed. Patient took a NTG- 1 and "pain subsided in a couple minutes- it was a sudden uncomfortableness," patient denies having any other symptoms. Pt denies having  sob, or fever at this time. All instructions explained to the pt, with a verbal understanding of the material. Pt agrees to go over the instructions while at home for a better understanding. Pt also instructed to self quarantine after being tested for COVID-19. The opportunity to ask questions was provided.

## 2019-05-09 NOTE — Progress Notes (Signed)
I called a prescription for Mupirocin ointment to [facility], [place], [place].  I spoke with Mr Mollica and informed him of positive nasal swab for MRSA and I asked patient about Covid test, patient had told me that he was going today.  Mr Zapanta said he got tied up and did not make it, he called [facility] and will go tomorrow at 1000.

## 2019-05-09 NOTE — Progress Notes (Signed)
[address]       Donald Hendrix [address]             [phone]     HPI: DonaldDonald Hendrix returns for additional consultation regarding coronary artery disease.  Donald Hendrix is a 68 year old gentleman with no prior history of coronary disease.  He does have a past medical history significant for hypertension and hyperlipidemia.  He also is overweight.  Over the past couple of years he has noted some chest tightness while mowing his lawn.  He usually does not experience it with walking only.  He recently noticed that is become more frequent, more severe, and last longer before resolving.  It has always resolved with rest.  He saw Dr. Hanley Hendrix and a stress Myoview was done.  It showed inferior and apical ischemia.  An echocardiogram earlier had shown aortic sclerosis but no significant stenosis.  He was referred to Dr. Gery Hendrix and underwent cardiac catheterization which revealed left main and three-vessel coronary disease.  He had a totally occluded right coronary and a diffusely diseased LAD.  He has not had any rest or nocturnal symptoms.  Past Medical History:  Diagnosis Date   Abnormal weight gain    High risk medication use    Hypercholesterolemia    Hyperlipidemia, mixed    Malignant hypertension    Vitamin D deficiency    Past Surgical History:  Procedure Laterality Date   LEFT HEART CATH AND CORONARY ANGIOGRAPHY N/A 05/01/2019   Procedure: LEFT HEART CATH AND CORONARY ANGIOGRAPHY;  Surgeon: Donald Gess, MD;  Location: [facility];  Service: Cardiovascular;  Laterality: N/A;   TOTAL HIP ARTHROPLASTY  2002    Current Outpatient Medications  Medication Sig Dispense Refill   aspirin EC 81 MG tablet Take 81 mg by mouth daily.     atorvastatin (LIPITOR) 80 MG tablet Take 1 tablet (80 mg total) by mouth daily at 6 PM. 90 tablet 1   colchicine 0.6 MG tablet Take 0.6 mg by mouth daily as needed (gout).      esomeprazole (NEXIUM) 40 MG capsule Take 40  mg by mouth daily.      etodolac (LODINE) 400 MG tablet Take 400 mg by mouth 2 (two) times daily.     isosorbide mononitrate (IMDUR) 30 MG 24 hr tablet Take 1 tablet (30 mg total) by mouth daily. 30 tablet 1   lisinopril (ZESTRIL) 5 MG tablet Take 5 mg by mouth daily.     metoprolol succinate (TOPROL-XL) 25 MG 24 hr tablet Take 25 mg by mouth daily.     Multiple Vitamin (MULTI-VITAMIN DAILY PO) Take 1 tablet by mouth daily.     nitroGLYCERIN (NITROSTAT) 0.4 MG SL tablet Place 0.4 mg under the tongue every 5 (five) minutes as needed for chest pain.     PARoxetine (PAXIL) 10 MG tablet Take 10 mg by mouth daily.     No current facility-administered medications for this visit.     Physical Exam BP (!) 156/89 (BP Location: Right Arm, Patient Position: Sitting, Cuff Size: Normal)    Pulse 65    Temp (!) 97.5 F (36.4 C)    Resp 16    Ht 6' (1.829 m)    Wt 284 lb (128.8 kg)    SpO2 96% Comment: RA   BMI 38.51 kg/m  68 year old man in no acute distress Alert and oriented x3 with no focal deficits No carotid bruits Cardiac regular rate and rhythm with  a 2/6 systolic murmur at the right upper sternal border Lungs clear with equal breath sounds bilaterally Normal Allen's test left arm Pulses intact Abdomen soft nontender No peripheral edema.  Diagnostic Tests: Cardiac catheterization Conclusion    Ost RCA to Prox RCA lesion is 100% stenosed.  Mid LM to Dist LM lesion is 70% stenosed.  Dist Cx lesion is 95% stenosed.  Prox Cx lesion is 60% stenosed.  1st Mrg lesion is 70% stenosed.  Prox LAD to Mid LAD lesion is 99% stenosed.  Mid LAD lesion is 80% stenosed.   I personally reviewed the catheterization images and concur with the findings noted above  Impression: Donald Hendrix is a 68 year old man with a past medical history significant for hypertension, hyperlipidemia, gout, and being overweight.  He has no history of tobacco abuse.  He does have a family history with a  sister having had cardiac interventions.  He has about a 2-year history of exertional chest tightness.  This is become more frequent, severe, and longer lasting over the past couple of months.  That led to a cardiac work-up which revealed left main and three-vessel coronary disease.  He has normal left ventricular function by echocardiogram.  Coronary bypass grafting is indicated for survival benefit and relief of symptoms.  I discussed the general nature of the operation with Donald Hendrix.  I informed him of the incisions to be used, the need for general anesthesia, the use of cardiopulmonary bypass, the use of drainage tubes and pacing wires postoperatively, the expected hospital stay, and the overall recovery.  I informed him of the indications, risk, benefits, and alternatives.  He understands the risks include, but not limited to death, MI, stroke, DVT, PE, bleeding, possible need for transfusion, infection, cardiac arrhythmias, respiratory or renal failure, as well as the possibility of other unforeseeable complications.  He has a normal Allen's test on the left.  I think his left radial would be a good bypass with a distal OM which has a tight stenosis.  We will plan to use it at the time of surgery.  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  He is very interested in getting home as quickly as possible.  I reassured him that we would discharge him as soon as it was medically safe.  He does understand he will have some limitations when he first gets home.  Hypertension-blood pressure elevated.  Will work on that in the postoperative period  Plan: Coronary artery bypass grafting using left radial artery on Friday, 05/12/2019  Donald Nakayama, MD [facility] [phone]

## 2019-05-10 ENCOUNTER — Other Ambulatory Visit (HOSPITAL_COMMUNITY)
Admission: RE | Admit: 2019-05-10 | Discharge: 2019-05-10 | Disposition: A | Discharge status: Home / Self Care | Payer: BC Managed Care – PPO | Source: Ambulatory Visit | Attending: Thoracic Surgery (Cardiothoracic Vascular Surgery) | Admitting: Thoracic Surgery (Cardiothoracic Vascular Surgery)

## 2019-05-10 DIAGNOSIS — Z20828 Contact with and (suspected) exposure to other viral communicable diseases: Secondary | ICD-10-CM | POA: Insufficient documentation

## 2019-05-10 DIAGNOSIS — I2510 Atherosclerotic heart disease of native coronary artery without angina pectoris: Secondary | ICD-10-CM

## 2019-05-10 DIAGNOSIS — Z01812 Encounter for preprocedural laboratory examination: Secondary | ICD-10-CM | POA: Diagnosis not present

## 2019-05-10 LAB — SARS CORONAVIRUS 2 (TAT 6-24 HRS): SARS Coronavirus 2: NEGATIVE

## 2019-05-11 MED ORDER — VANCOMYCIN HCL 10 G IV SOLR
1500.0000 mg | INTRAVENOUS | Status: AC
  Administered 2019-05-12: 1500 mg via INTRAVENOUS
  Filled 2019-05-11: qty 1500

## 2019-05-11 MED ORDER — TRANEXAMIC ACID 1000 MG/10ML IV SOLN
1.5000 mg/kg/h | INTRAVENOUS | Status: DC
  Filled 2019-05-11: qty 25

## 2019-05-11 MED ORDER — NOREPINEPHRINE 4 MG/250ML-% IV SOLN
0.0000 ug/min | INTRAVENOUS | Status: DC
  Filled 2019-05-11: qty 250

## 2019-05-11 MED ORDER — SODIUM CHLORIDE 0.9 % IV SOLN
INTRAVENOUS | Status: DC
  Filled 2019-05-11: qty 30

## 2019-05-11 MED ORDER — TRANEXAMIC ACID (OHS) BOLUS VIA INFUSION
15.0000 mg/kg | INTRAVENOUS | Status: AC
  Administered 2019-05-12: 1743 mg via INTRAVENOUS
  Filled 2019-05-11: qty 1743

## 2019-05-11 MED ORDER — MAGNESIUM SULFATE 50 % IJ SOLN
40.0000 meq | INTRAMUSCULAR | Status: DC
  Filled 2019-05-11: qty 9.85

## 2019-05-11 MED ORDER — DEXMEDETOMIDINE HCL IN NACL 400 MCG/100ML IV SOLN
0.1000 ug/kg/h | INTRAVENOUS | Status: AC
  Administered 2019-05-12: 13:00:00 .7 ug/kg/h via INTRAVENOUS
  Filled 2019-05-11: qty 100

## 2019-05-11 MED ORDER — NITROGLYCERIN IN D5W 200-5 MCG/ML-% IV SOLN
2.0000 ug/min | INTRAVENOUS | Status: AC
  Administered 2019-05-12: 16.6 ug/min via INTRAVENOUS
  Filled 2019-05-11: qty 250

## 2019-05-11 MED ORDER — PLASMA-LYTE 148 IV SOLN
INTRAVENOUS | Status: AC
  Administered 2019-05-12: 500 mL
  Filled 2019-05-11: qty 2.5

## 2019-05-11 MED ORDER — POTASSIUM CHLORIDE 2 MEQ/ML IV SOLN
80.0000 meq | INTRAVENOUS | Status: DC
  Filled 2019-05-11: qty 40

## 2019-05-11 MED ORDER — LEVOFLOXACIN IN D5W 500 MG/100ML IV SOLN
500.0000 mg | INTRAVENOUS | Status: DC
  Filled 2019-05-11: qty 100

## 2019-05-11 MED ORDER — INSULIN REGULAR(HUMAN) IN NACL 100-0.9 UT/100ML-% IV SOLN
INTRAVENOUS | Status: AC
  Administered 2019-05-12: 09:00:00 1 [IU]/h via INTRAVENOUS
  Filled 2019-05-11: qty 100

## 2019-05-11 MED ORDER — PHENYLEPHRINE HCL-NACL 20-0.9 MG/250ML-% IV SOLN
30.0000 ug/min | INTRAVENOUS | Status: DC
  Filled 2019-05-11: qty 250

## 2019-05-11 MED ORDER — TRANEXAMIC ACID (OHS) PUMP PRIME SOLUTION
2.0000 mg/kg | INTRAVENOUS | Status: DC
  Filled 2019-05-11: qty 2.32

## 2019-05-11 MED ORDER — MILRINONE LACTATE IN DEXTROSE 20-5 MG/100ML-% IV SOLN
0.3000 ug/kg/min | INTRAVENOUS | Status: DC
  Filled 2019-05-11: qty 100

## 2019-05-11 MED ORDER — EPINEPHRINE HCL 5 MG/250ML IV SOLN IN NS
0.0000 ug/min | INTRAVENOUS | Status: DC
  Filled 2019-05-11: qty 250

## 2019-05-11 MED ORDER — DOPAMINE-DEXTROSE 3.2-5 MG/ML-% IV SOLN
0.0000 ug/kg/min | INTRAVENOUS | Status: AC
  Administered 2019-05-12: 3 ug/kg/min via INTRAVENOUS
  Filled 2019-05-11: qty 250

## 2019-05-12 ENCOUNTER — Inpatient Hospital Stay (HOSPITAL_COMMUNITY): Payer: BC Managed Care – PPO

## 2019-05-12 ENCOUNTER — Inpatient Hospital Stay (HOSPITAL_COMMUNITY)
Admission: RE | Disposition: A | Discharge status: Home / Self Care | Payer: Self-pay | Source: Home / Self Care | Attending: Thoracic Surgery (Cardiothoracic Vascular Surgery)

## 2019-05-12 ENCOUNTER — Other Ambulatory Visit: Payer: Self-pay

## 2019-05-12 ENCOUNTER — Encounter (HOSPITAL_COMMUNITY): Payer: Self-pay | Admitting: *Deleted

## 2019-05-12 ENCOUNTER — Inpatient Hospital Stay (HOSPITAL_COMMUNITY)
Admission: RE | Admit: 2019-05-12 | Discharge: 2019-05-17 | DRG: 220 | Disposition: A | Discharge status: Home / Self Care | Payer: BC Managed Care – PPO | Attending: Thoracic Surgery (Cardiothoracic Vascular Surgery) | Admitting: Thoracic Surgery (Cardiothoracic Vascular Surgery)

## 2019-05-12 ENCOUNTER — Inpatient Hospital Stay (HOSPITAL_COMMUNITY): Payer: BC Managed Care – PPO | Admitting: Certified Registered Nurse Anesthetist

## 2019-05-12 ENCOUNTER — Inpatient Hospital Stay (HOSPITAL_COMMUNITY): Payer: BC Managed Care – PPO | Admitting: Physician Assistant

## 2019-05-12 DIAGNOSIS — Z951 Presence of aortocoronary bypass graft: Secondary | ICD-10-CM

## 2019-05-12 DIAGNOSIS — Z6833 Body mass index (BMI) 33.0-33.9, adult: Secondary | ICD-10-CM

## 2019-05-12 DIAGNOSIS — I10 Essential (primary) hypertension: Secondary | ICD-10-CM | POA: Diagnosis present

## 2019-05-12 DIAGNOSIS — I25118 Atherosclerotic heart disease of native coronary artery with other forms of angina pectoris: Principal | ICD-10-CM | POA: Diagnosis present

## 2019-05-12 DIAGNOSIS — Z23 Encounter for immunization: Secondary | ICD-10-CM | POA: Diagnosis not present

## 2019-05-12 DIAGNOSIS — Z7982 Long term (current) use of aspirin: Secondary | ICD-10-CM

## 2019-05-12 DIAGNOSIS — I4891 Unspecified atrial fibrillation: Secondary | ICD-10-CM | POA: Diagnosis not present

## 2019-05-12 DIAGNOSIS — J9811 Atelectasis: Secondary | ICD-10-CM | POA: Diagnosis not present

## 2019-05-12 DIAGNOSIS — Z09 Encounter for follow-up examination after completed treatment for conditions other than malignant neoplasm: Secondary | ICD-10-CM

## 2019-05-12 DIAGNOSIS — I2582 Chronic total occlusion of coronary artery: Secondary | ICD-10-CM | POA: Diagnosis present

## 2019-05-12 DIAGNOSIS — I4892 Unspecified atrial flutter: Secondary | ICD-10-CM | POA: Diagnosis not present

## 2019-05-12 DIAGNOSIS — M109 Gout, unspecified: Secondary | ICD-10-CM | POA: Diagnosis present

## 2019-05-12 DIAGNOSIS — I25110 Atherosclerotic heart disease of native coronary artery with unstable angina pectoris: Secondary | ICD-10-CM | POA: Diagnosis not present

## 2019-05-12 DIAGNOSIS — I2510 Atherosclerotic heart disease of native coronary artery without angina pectoris: Secondary | ICD-10-CM

## 2019-05-12 DIAGNOSIS — E559 Vitamin D deficiency, unspecified: Secondary | ICD-10-CM | POA: Diagnosis present

## 2019-05-12 DIAGNOSIS — I340 Nonrheumatic mitral (valve) insufficiency: Secondary | ICD-10-CM | POA: Diagnosis present

## 2019-05-12 DIAGNOSIS — Z79899 Other long term (current) drug therapy: Secondary | ICD-10-CM

## 2019-05-12 DIAGNOSIS — E782 Mixed hyperlipidemia: Secondary | ICD-10-CM | POA: Diagnosis present

## 2019-05-12 DIAGNOSIS — Z20828 Contact with and (suspected) exposure to other viral communicable diseases: Secondary | ICD-10-CM | POA: Diagnosis present

## 2019-05-12 DIAGNOSIS — Z96649 Presence of unspecified artificial hip joint: Secondary | ICD-10-CM | POA: Diagnosis present

## 2019-05-12 HISTORY — PX: RADIAL ARTERY HARVEST: SHX5067

## 2019-05-12 HISTORY — DX: Concussion with loss of consciousness of unspecified duration, initial encounter: S06.0X9A

## 2019-05-12 HISTORY — DX: Cardiac murmur, unspecified: R01.1

## 2019-05-12 HISTORY — DX: Concussion with loss of consciousness status unknown, initial encounter: S06.0XAA

## 2019-05-12 HISTORY — PX: MITRAL VALVE REPAIR: SHX2039

## 2019-05-12 HISTORY — DX: Unspecified osteoarthritis, unspecified site: M19.90

## 2019-05-12 HISTORY — PX: CORONARY ARTERY BYPASS GRAFT: SHX141

## 2019-05-12 HISTORY — PX: TEE WITHOUT CARDIOVERSION: SHX5443

## 2019-05-12 HISTORY — DX: Gastro-esophageal reflux disease without esophagitis: K21.9

## 2019-05-12 HISTORY — DX: Atherosclerotic heart disease of native coronary artery without angina pectoris: I25.10

## 2019-05-12 LAB — BASIC METABOLIC PANEL
Anion gap: 7 (ref 5–15)
BUN: 13 mg/dL (ref 8–23)
CO2: 21 mmol/L — ABNORMAL LOW (ref 22–32)
Calcium: 7.7 mg/dL — ABNORMAL LOW (ref 8.9–10.3)
Chloride: 110 mmol/L (ref 98–111)
Creatinine, Ser: 1.11 mg/dL (ref 0.61–1.24)
GFR calc Af Amer: >60 mL/min (ref >60)
GFR calc non Af Amer: >60 mL/min (ref >60)
Glucose, Bld: 136 mg/dL — ABNORMAL HIGH (ref 70–99)
Potassium: 3.9 mmol/L (ref 3.5–5.1)
Sodium: 138 mmol/L (ref 135–145)

## 2019-05-12 LAB — POCT I-STAT 7, (LYTES, BLD GAS, ICA,H+H)
Acid-base deficit: 2 mmol/L (ref 0.0–2.0)
Acid-base deficit: 5 mmol/L — ABNORMAL HIGH (ref 0.0–2.0)
Acid-base deficit: 3 mmol/L — ABNORMAL HIGH (ref 0.0–2.0)
Bicarbonate: 22.9 mmol/L (ref 20.0–28.0)
Bicarbonate: 20.6 mmol/L (ref 20.0–28.0)
Bicarbonate: 21.1 mmol/L (ref 20.0–28.0)
Calcium, Ion: 1.13 mmol/L — ABNORMAL LOW (ref 1.15–1.40)
Calcium, Ion: 1.23 mmol/L (ref 1.15–1.40)
Calcium, Ion: 1.14 mmol/L — ABNORMAL LOW (ref 1.15–1.40)
HCT: 36 % — ABNORMAL LOW (ref 39.0–52.0)
HCT: 32 % — ABNORMAL LOW (ref 39.0–52.0)
HCT: 38 % — ABNORMAL LOW (ref 39.0–52.0)
Hemoglobin: 12.2 g/dL — ABNORMAL LOW (ref 13.0–17.0)
Hemoglobin: 10.9 g/dL — ABNORMAL LOW (ref 13.0–17.0)
Hemoglobin: 12.9 g/dL — ABNORMAL LOW (ref 13.0–17.0)
O2 Saturation: 98 %
O2 Saturation: 96 %
O2 Saturation: 97 %
Patient temperature: 37.3
Patient temperature: 37.6
Potassium: 4 mmol/L (ref 3.5–5.1)
Potassium: 3.8 mmol/L (ref 3.5–5.1)
Potassium: 3.9 mmol/L (ref 3.5–5.1)
Sodium: 139 mmol/L (ref 135–145)
Sodium: 141 mmol/L (ref 135–145)
Sodium: 141 mmol/L (ref 135–145)
TCO2: 24 mmol/L (ref 22–32)
TCO2: 22 mmol/L (ref 22–32)
TCO2: 22 mmol/L (ref 22–32)
pCO2 arterial: 40.4 mmHg (ref 32.0–48.0)
pCO2 arterial: 38.6 mmHg (ref 32.0–48.0)
pCO2 arterial: 33.3 mmHg (ref 32.0–48.0)
pH, Arterial: 7.362 (ref 7.350–7.450)
pH, Arterial: 7.338 — ABNORMAL LOW (ref 7.350–7.450)
pH, Arterial: 7.409 (ref 7.350–7.450)
pO2, Arterial: 108 mmHg (ref 83.0–108.0)
pO2, Arterial: 91 mmHg (ref 83.0–108.0)
pO2, Arterial: 93 mmHg (ref 83.0–108.0)

## 2019-05-12 LAB — CBC
HCT: 39.3 % (ref 39.0–52.0)
HCT: 34.2 % — ABNORMAL LOW (ref 39.0–52.0)
Hemoglobin: 13.5 g/dL (ref 13.0–17.0)
Hemoglobin: 11.9 g/dL — ABNORMAL LOW (ref 13.0–17.0)
MCH: 30.8 pg (ref 26.0–34.0)
MCH: 31.3 pg (ref 26.0–34.0)
MCHC: 34.4 g/dL (ref 30.0–36.0)
MCHC: 34.8 g/dL (ref 30.0–36.0)
MCV: 89.7 fL (ref 80.0–100.0)
MCV: 90 fL (ref 80.0–100.0)
Platelets: 173 10*3/uL (ref 150–400)
Platelets: 154 10*3/uL (ref 150–400)
RBC: 4.38 MIL/uL (ref 4.22–5.81)
RBC: 3.8 MIL/uL — ABNORMAL LOW (ref 4.22–5.81)
RDW: 12.8 % (ref 11.5–15.5)
RDW: 13.1 % (ref 11.5–15.5)
WBC: 14.8 10*3/uL — ABNORMAL HIGH (ref 4.0–10.5)
WBC: 15.2 10*3/uL — ABNORMAL HIGH (ref 4.0–10.5)
nRBC: 0 % (ref 0.0–0.2)
nRBC: 0 % (ref 0.0–0.2)

## 2019-05-12 LAB — PROTIME-INR
INR: 1.5 — ABNORMAL HIGH (ref 0.8–1.2)
Prothrombin Time: 18 seconds — ABNORMAL HIGH (ref 11.4–15.2)

## 2019-05-12 LAB — GLUCOSE, CAPILLARY
Glucose-Capillary: 110 mg/dL — ABNORMAL HIGH (ref 70–99)
Glucose-Capillary: 136 mg/dL — ABNORMAL HIGH (ref 70–99)
Glucose-Capillary: 115 mg/dL — ABNORMAL HIGH (ref 70–99)
Glucose-Capillary: 133 mg/dL — ABNORMAL HIGH (ref 70–99)
Glucose-Capillary: 141 mg/dL — ABNORMAL HIGH (ref 70–99)
Glucose-Capillary: 137 mg/dL — ABNORMAL HIGH (ref 70–99)
Glucose-Capillary: 127 mg/dL — ABNORMAL HIGH (ref 70–99)
Glucose-Capillary: 127 mg/dL — ABNORMAL HIGH (ref 70–99)

## 2019-05-12 LAB — PREPARE RBC (CROSSMATCH)

## 2019-05-12 LAB — HEMOGLOBIN AND HEMATOCRIT, BLOOD
HCT: 28.9 % — ABNORMAL LOW (ref 39.0–52.0)
Hemoglobin: 10 g/dL — ABNORMAL LOW (ref 13.0–17.0)

## 2019-05-12 LAB — PLATELET COUNT: Platelets: 162 10*3/uL (ref 150–400)

## 2019-05-12 LAB — MAGNESIUM: Magnesium: 2.6 mg/dL — ABNORMAL HIGH (ref 1.7–2.4)

## 2019-05-12 LAB — APTT: aPTT: 27 seconds (ref 24–36)

## 2019-05-12 SURGERY — CORONARY ARTERY BYPASS GRAFTING (CABG)
Anesthesia: General | Site: Chest

## 2019-05-12 MED ORDER — BISACODYL 10 MG RE SUPP: 10.0000 mg | Freq: Every day | RECTAL | Status: DC

## 2019-05-12 MED ORDER — PHENYLEPHRINE HCL-NACL 20-0.9 MG/250ML-% IV SOLN
0.0000 ug/min | INTRAVENOUS | Status: DC
  Administered 2019-05-12: 20 ug/min via INTRAVENOUS
  Administered 2019-05-13: 30 ug/min via INTRAVENOUS
  Filled 2019-05-12: qty 250

## 2019-05-12 MED ORDER — MUPIROCIN 2 % EX OINT
TOPICAL_OINTMENT | CUTANEOUS | Status: AC
  Administered 2019-05-12: 1 via TOPICAL
  Filled 2019-05-12: qty 22

## 2019-05-12 MED ORDER — LEVOFLOXACIN IN D5W 750 MG/150ML IV SOLN
750.0000 mg | INTRAVENOUS | Status: AC
  Administered 2019-05-13: 750 mg via INTRAVENOUS
  Filled 2019-05-12: qty 150

## 2019-05-12 MED ORDER — SODIUM CHLORIDE 0.9 % IV SOLN
INTRAVENOUS | Status: DC | PRN
  Administered 2019-05-12: 08:00:00 60 ug/min via INTRAVENOUS

## 2019-05-12 MED ORDER — MAGNESIUM SULFATE 4 GM/100ML IV SOLN
4.0000 g | Freq: Once | INTRAVENOUS | Status: AC
  Administered 2019-05-12: 4 g via INTRAVENOUS
  Filled 2019-05-12: qty 100

## 2019-05-12 MED ORDER — MIDAZOLAM HCL 2 MG/2ML IJ SOLN
INTRAMUSCULAR | Status: AC
  Filled 2019-05-12: qty 2

## 2019-05-12 MED ORDER — METOPROLOL TARTRATE 5 MG/5ML IV SOLN
2.5000 mg | INTRAVENOUS | Status: DC | PRN
  Administered 2019-05-15: 2.5 mg via INTRAVENOUS
  Filled 2019-05-12: qty 5

## 2019-05-12 MED ORDER — METOPROLOL TARTRATE 12.5 MG HALF TABLET
12.5000 mg | ORAL_TABLET | Freq: Two times a day (BID) | ORAL | Status: DC
  Administered 2019-05-13 – 2019-05-14 (×2): 12.5 mg via ORAL
  Filled 2019-05-12 (×3): qty 1

## 2019-05-12 MED ORDER — SUCCINYLCHOLINE CHLORIDE 200 MG/10ML IV SOSY
PREFILLED_SYRINGE | INTRAVENOUS | Status: AC
  Filled 2019-05-12: qty 10

## 2019-05-12 MED ORDER — DOCUSATE SODIUM 100 MG PO CAPS
200.0000 mg | ORAL_CAPSULE | Freq: Every day | ORAL | Status: DC
  Administered 2019-05-13 – 2019-05-15 (×3): 200 mg via ORAL
  Filled 2019-05-12 (×4): qty 2

## 2019-05-12 MED ORDER — CHLORHEXIDINE GLUCONATE 0.12 % MT SOLN
15.0000 mL | Freq: Once | OROMUCOSAL | Status: AC
  Administered 2019-05-12: 15 mL via OROMUCOSAL
  Filled 2019-05-12: qty 15

## 2019-05-12 MED ORDER — ORAL CARE MOUTH RINSE
15.0000 mL | Freq: Two times a day (BID) | OROMUCOSAL | Status: DC
  Administered 2019-05-12 – 2019-05-17 (×9): 15 mL via OROMUCOSAL

## 2019-05-12 MED ORDER — TRANEXAMIC ACID-NACL 1000-0.7 MG/100ML-% IV SOLN
1000.0000 mg | INTRAVENOUS | Status: DC
  Filled 2019-05-12: qty 100

## 2019-05-12 MED ORDER — TRAMADOL HCL 50 MG PO TABS: 50.0000 mg | ORAL_TABLET | ORAL | Status: DC | PRN

## 2019-05-12 MED ORDER — INSULIN REGULAR(HUMAN) IN NACL 100-0.9 UT/100ML-% IV SOLN
INTRAVENOUS | Status: DC
  Administered 2019-05-12: 1 [IU]/h via INTRAVENOUS

## 2019-05-12 MED ORDER — LIDOCAINE 2% (20 MG/ML) 5 ML SYRINGE
INTRAMUSCULAR | Status: AC
  Filled 2019-05-12: qty 5

## 2019-05-12 MED ORDER — FENTANYL CITRATE (PF) 250 MCG/5ML IJ SOLN
INTRAMUSCULAR | Status: DC | PRN
  Administered 2019-05-12: 100 ug via INTRAVENOUS
  Administered 2019-05-12: 150 ug via INTRAVENOUS

## 2019-05-12 MED ORDER — ACETAMINOPHEN 500 MG PO TABS
1000.0000 mg | ORAL_TABLET | Freq: Four times a day (QID) | ORAL | Status: DC
  Administered 2019-05-12 – 2019-05-16 (×16): 1000 mg via ORAL
  Filled 2019-05-12 (×17): qty 2

## 2019-05-12 MED ORDER — MUPIROCIN 2 % EX OINT
1.0000 "application " | TOPICAL_OINTMENT | Freq: Two times a day (BID) | CUTANEOUS | Status: DC
  Administered 2019-05-12: 06:00:00 1 via TOPICAL

## 2019-05-12 MED ORDER — DEXMEDETOMIDINE HCL IN NACL 400 MCG/100ML IV SOLN
0.0000 ug/kg/h | INTRAVENOUS | Status: DC
  Administered 2019-05-12: 0.7 ug/kg/h via INTRAVENOUS
  Filled 2019-05-12: qty 100

## 2019-05-12 MED ORDER — SODIUM CHLORIDE 0.9% FLUSH
10.0000 mL | Freq: Two times a day (BID) | INTRAVENOUS | Status: DC
  Administered 2019-05-12 – 2019-05-14 (×4): 10 mL

## 2019-05-12 MED ORDER — INSULIN REGULAR BOLUS VIA INFUSION
0.0000 [IU] | Freq: Three times a day (TID) | INTRAVENOUS | Status: DC
  Filled 2019-05-12: qty 10

## 2019-05-12 MED ORDER — ROCURONIUM BROMIDE 10 MG/ML (PF) SYRINGE
PREFILLED_SYRINGE | INTRAVENOUS | Status: AC
  Filled 2019-05-12: qty 10

## 2019-05-12 MED ORDER — HEMOSTATIC AGENTS (NO CHARGE) OPTIME
TOPICAL | Status: DC | PRN
  Administered 2019-05-12: 1 via TOPICAL

## 2019-05-12 MED ORDER — PROTAMINE SULFATE 10 MG/ML IV SOLN
INTRAVENOUS | Status: DC | PRN
  Administered 2019-05-12: 320 mg via INTRAVENOUS
  Administered 2019-05-12: 30 mg via INTRAVENOUS

## 2019-05-12 MED ORDER — LACTATED RINGERS IV SOLN
INTRAVENOUS | Status: DC | PRN
  Administered 2019-05-12: 08:00:00 via INTRAVENOUS

## 2019-05-12 MED ORDER — SODIUM CHLORIDE 0.9% FLUSH
3.0000 mL | Freq: Two times a day (BID) | INTRAVENOUS | Status: DC
  Administered 2019-05-13 (×2): 3 mL via INTRAVENOUS

## 2019-05-12 MED ORDER — PANTOPRAZOLE SODIUM 40 MG PO TBEC
40.0000 mg | DELAYED_RELEASE_TABLET | Freq: Every day | ORAL | Status: DC
  Administered 2019-05-14 – 2019-05-17 (×4): 40 mg via ORAL
  Filled 2019-05-12 (×4): qty 1

## 2019-05-12 MED ORDER — SODIUM CHLORIDE 0.9% FLUSH: 3.0000 mL | INTRAVENOUS | Status: DC | PRN

## 2019-05-12 MED ORDER — ACETAMINOPHEN 160 MG/5ML PO SOLN: 1000.0000 mg | Freq: Four times a day (QID) | ORAL | Status: DC

## 2019-05-12 MED ORDER — MIDAZOLAM HCL 2 MG/2ML IJ SOLN
2.0000 mg | INTRAMUSCULAR | Status: DC | PRN
  Administered 2019-05-12: 2 mg via INTRAVENOUS
  Filled 2019-05-12: qty 2

## 2019-05-12 MED ORDER — SODIUM CHLORIDE 0.9 % IV SOLN: INTRAVENOUS | Status: DC

## 2019-05-12 MED ORDER — LACTATED RINGERS IV SOLN
INTRAVENOUS | Status: DC
  Administered 2019-05-12: 16:00:00 via INTRAVENOUS

## 2019-05-12 MED ORDER — DOPAMINE-DEXTROSE 3.2-5 MG/ML-% IV SOLN
0.0000 ug/kg/min | INTRAVENOUS | Status: DC
  Administered 2019-05-12: 2 ug/kg/min via INTRAVENOUS

## 2019-05-12 MED ORDER — FAMOTIDINE IN NACL 20-0.9 MG/50ML-% IV SOLN
20.0000 mg | Freq: Two times a day (BID) | INTRAVENOUS | Status: AC
  Administered 2019-05-12 (×2): 20 mg via INTRAVENOUS
  Filled 2019-05-12: qty 50

## 2019-05-12 MED ORDER — INFLUENZA VAC A&B SA ADJ QUAD 0.5 ML IM PRSY
0.5000 mL | PREFILLED_SYRINGE | INTRAMUSCULAR | Status: AC
  Administered 2019-05-17: 0.5 mL via INTRAMUSCULAR
  Filled 2019-05-12: qty 0.5

## 2019-05-12 MED ORDER — MUPIROCIN 2 % EX OINT
1.0000 "application " | TOPICAL_OINTMENT | Freq: Two times a day (BID) | CUTANEOUS | Status: DC
  Administered 2019-05-12 – 2019-05-16 (×9): 1 via NASAL
  Filled 2019-05-12: qty 22

## 2019-05-12 MED ORDER — HEPARIN SODIUM (PORCINE) 1000 UNIT/ML IJ SOLN
INTRAMUSCULAR | Status: DC | PRN
  Administered 2019-05-12: 2000 [IU] via INTRAVENOUS
  Administered 2019-05-12: 38000 [IU] via INTRAVENOUS

## 2019-05-12 MED ORDER — BISACODYL 5 MG PO TBEC
10.0000 mg | DELAYED_RELEASE_TABLET | Freq: Every day | ORAL | Status: DC
  Administered 2019-05-13 – 2019-05-14 (×2): 10 mg via ORAL
  Filled 2019-05-12 (×4): qty 2

## 2019-05-12 MED ORDER — CHLORHEXIDINE GLUCONATE 0.12 % MT SOLN
15.0000 mL | OROMUCOSAL | Status: AC
  Administered 2019-05-12: 15 mL via OROMUCOSAL

## 2019-05-12 MED ORDER — LACTATED RINGERS IV SOLN: INTRAVENOUS | Status: DC

## 2019-05-12 MED ORDER — SODIUM CHLORIDE 0.9 % IV SOLN
250.0000 mL | INTRAVENOUS | Status: DC
  Administered 2019-05-13: 250 mL via INTRAVENOUS

## 2019-05-12 MED ORDER — EPHEDRINE SULFATE 50 MG/ML IJ SOLN
INTRAMUSCULAR | Status: DC | PRN
  Administered 2019-05-12: 5 mg via INTRAVENOUS
  Administered 2019-05-12: 10 mg via INTRAVENOUS
  Administered 2019-05-12: 5 mg via INTRAVENOUS
  Administered 2019-05-12: 10 mg via INTRAVENOUS
  Administered 2019-05-12: 5 mg via INTRAVENOUS

## 2019-05-12 MED ORDER — MIDAZOLAM HCL (PF) 10 MG/2ML IJ SOLN
INTRAMUSCULAR | Status: AC
  Filled 2019-05-12: qty 2

## 2019-05-12 MED ORDER — CALCIUM CHLORIDE 10 % IV SOLN
1.0000 g | Freq: Once | INTRAVENOUS | Status: AC
  Administered 2019-05-12: 1 g via INTRAVENOUS

## 2019-05-12 MED ORDER — MIDAZOLAM HCL 5 MG/5ML IJ SOLN
INTRAMUSCULAR | Status: DC | PRN
  Administered 2019-05-12: 2 mg via INTRAVENOUS
  Administered 2019-05-12: 3 mg via INTRAVENOUS
  Administered 2019-05-12: 2 mg via INTRAVENOUS
  Administered 2019-05-12: 3 mg via INTRAVENOUS
  Administered 2019-05-12: 2 mg via INTRAVENOUS

## 2019-05-12 MED ORDER — PROPOFOL 10 MG/ML IV BOLUS
INTRAVENOUS | Status: DC | PRN
  Administered 2019-05-12 (×2): 30 mg via INTRAVENOUS

## 2019-05-12 MED ORDER — VECURONIUM BROMIDE 10 MG IV SOLR
INTRAVENOUS | Status: AC
  Filled 2019-05-12: qty 10

## 2019-05-12 MED ORDER — METOPROLOL TARTRATE 12.5 MG HALF TABLET: 12.5000 mg | ORAL_TABLET | Freq: Once | ORAL | Status: DC

## 2019-05-12 MED ORDER — ATORVASTATIN CALCIUM 80 MG PO TABS
80.0000 mg | ORAL_TABLET | Freq: Every day | ORAL | Status: DC
  Administered 2019-05-13 – 2019-05-16 (×4): 80 mg via ORAL
  Filled 2019-05-12 (×4): qty 1

## 2019-05-12 MED ORDER — MORPHINE SULFATE (PF) 2 MG/ML IV SOLN
1.0000 mg | INTRAVENOUS | Status: DC | PRN
  Administered 2019-05-12: 4 mg via INTRAVENOUS
  Administered 2019-05-12 (×2): 2 mg via INTRAVENOUS
  Filled 2019-05-12: qty 1
  Filled 2019-05-12: qty 2
  Filled 2019-05-12: qty 1
  Filled 2019-05-12: qty 2

## 2019-05-12 MED ORDER — SODIUM CHLORIDE 0.45 % IV SOLN
INTRAVENOUS | Status: DC | PRN
  Administered 2019-05-12: 16:00:00 via INTRAVENOUS

## 2019-05-12 MED ORDER — 0.9 % SODIUM CHLORIDE (POUR BTL) OPTIME
TOPICAL | Status: DC | PRN
  Administered 2019-05-12: 1000 mL
  Administered 2019-05-12: 5000 mL
  Administered 2019-05-12: 2000 mL

## 2019-05-12 MED ORDER — OXYCODONE HCL 5 MG PO TABS
5.0000 mg | ORAL_TABLET | ORAL | Status: DC | PRN
  Administered 2019-05-13 – 2019-05-14 (×4): 10 mg via ORAL
  Administered 2019-05-14: 5 mg via ORAL
  Filled 2019-05-12 (×2): qty 2
  Filled 2019-05-12: qty 1
  Filled 2019-05-12 (×2): qty 2

## 2019-05-12 MED ORDER — POTASSIUM CHLORIDE 10 MEQ/50ML IV SOLN: 10.0000 meq | INTRAVENOUS | Status: AC

## 2019-05-12 MED ORDER — CHLORHEXIDINE GLUCONATE 0.12% ORAL RINSE (MEDLINE KIT): 15.0000 mL | Freq: Two times a day (BID) | OROMUCOSAL | Status: DC

## 2019-05-12 MED ORDER — HEPARIN SODIUM (PORCINE) 1000 UNIT/ML IJ SOLN
INTRAMUSCULAR | Status: AC
  Filled 2019-05-12: qty 1

## 2019-05-12 MED ORDER — ACETAMINOPHEN 160 MG/5ML PO SOLN
650.0000 mg | Freq: Once | ORAL | Status: AC
  Administered 2019-05-12: 650 mg

## 2019-05-12 MED ORDER — VECURONIUM BROMIDE 10 MG IV SOLR
INTRAVENOUS | Status: DC | PRN
  Administered 2019-05-12 (×2): 5 mg via INTRAVENOUS
  Administered 2019-05-12: 12 mg via INTRAVENOUS
  Administered 2019-05-12: 5 mg via INTRAVENOUS
  Administered 2019-05-12: 8 mg via INTRAVENOUS

## 2019-05-12 MED ORDER — ORAL CARE MOUTH RINSE: 15.0000 mL | OROMUCOSAL | Status: DC

## 2019-05-12 MED ORDER — ASPIRIN EC 325 MG PO TBEC
325.0000 mg | DELAYED_RELEASE_TABLET | Freq: Every day | ORAL | Status: DC
  Administered 2019-05-13 – 2019-05-15 (×3): 325 mg via ORAL
  Filled 2019-05-12 (×3): qty 1

## 2019-05-12 MED ORDER — PROPOFOL 10 MG/ML IV BOLUS
INTRAVENOUS | Status: AC
  Filled 2019-05-12: qty 40

## 2019-05-12 MED ORDER — NITROGLYCERIN IN D5W 200-5 MCG/ML-% IV SOLN
0.0000 ug/min | INTRAVENOUS | Status: DC
  Administered 2019-05-12: 25 ug/min via INTRAVENOUS

## 2019-05-12 MED ORDER — CHLORHEXIDINE GLUCONATE 4 % EX LIQD: 30.0000 mL | CUTANEOUS | Status: DC

## 2019-05-12 MED ORDER — LEVOFLOXACIN IN D5W 250 MG/50ML IV SOLN
INTRAVENOUS | Status: DC | PRN
  Administered 2019-05-12: 500 mg via INTRAVENOUS

## 2019-05-12 MED ORDER — ALBUMIN HUMAN 5 % IV SOLN
250.0000 mL | INTRAVENOUS | Status: AC | PRN
  Administered 2019-05-12 (×3): 12.5 g via INTRAVENOUS
  Filled 2019-05-12: qty 250

## 2019-05-12 MED ORDER — LACTATED RINGERS IV SOLN: 500.0000 mL | Freq: Once | INTRAVENOUS | Status: DC | PRN

## 2019-05-12 MED ORDER — ONDANSETRON HCL 4 MG/2ML IJ SOLN: 4.0000 mg | Freq: Four times a day (QID) | INTRAMUSCULAR | Status: DC | PRN

## 2019-05-12 MED ORDER — SODIUM CHLORIDE 0.9% FLUSH
3.0000 mL | Freq: Two times a day (BID) | INTRAVENOUS | Status: DC
  Administered 2019-05-12 – 2019-05-14 (×5): 3 mL via INTRAVENOUS

## 2019-05-12 MED ORDER — SODIUM CHLORIDE 0.9% FLUSH: 10.0000 mL | INTRAVENOUS | Status: DC | PRN

## 2019-05-12 MED ORDER — METOPROLOL TARTRATE 25 MG/10 ML ORAL SUSPENSION: 12.5000 mg | Freq: Two times a day (BID) | ORAL | Status: DC

## 2019-05-12 MED ORDER — VANCOMYCIN HCL IN DEXTROSE 1-5 GM/200ML-% IV SOLN
1000.0000 mg | Freq: Once | INTRAVENOUS | Status: AC
  Administered 2019-05-12: 1000 mg via INTRAVENOUS
  Filled 2019-05-12: qty 200

## 2019-05-12 MED ORDER — SUFENTANIL CITRATE 50 MCG/ML IV SOLN
INTRAVENOUS | Status: DC | PRN
  Administered 2019-05-12 (×5): 20 ug via INTRAVENOUS
  Administered 2019-05-12: 50 ug via INTRAVENOUS
  Administered 2019-05-12: 20 ug via INTRAVENOUS

## 2019-05-12 MED ORDER — FENTANYL CITRATE (PF) 250 MCG/5ML IJ SOLN
INTRAMUSCULAR | Status: AC
  Filled 2019-05-12: qty 5

## 2019-05-12 MED ORDER — PAROXETINE HCL 10 MG PO TABS
10.0000 mg | ORAL_TABLET | Freq: Every day | ORAL | Status: DC
  Administered 2019-05-13 – 2019-05-17 (×5): 10 mg via ORAL
  Filled 2019-05-12 (×5): qty 1

## 2019-05-12 MED ORDER — ASPIRIN 81 MG PO CHEW: 324.0000 mg | CHEWABLE_TABLET | Freq: Every day | ORAL | Status: DC

## 2019-05-12 MED ORDER — SODIUM CHLORIDE (PF) 0.9 % IJ SOLN
OROMUCOSAL | Status: DC | PRN
  Administered 2019-05-12 (×3): 4 mL via TOPICAL

## 2019-05-12 MED ORDER — CHLORHEXIDINE GLUCONATE CLOTH 2 % EX PADS
6.0000 | MEDICATED_PAD | Freq: Every day | CUTANEOUS | Status: AC
  Administered 2019-05-13 – 2019-05-17 (×5): 6 via TOPICAL

## 2019-05-12 MED ORDER — ACETAMINOPHEN 650 MG RE SUPP: 650.0000 mg | Freq: Once | RECTAL | Status: AC

## 2019-05-12 MED ORDER — SUFENTANIL CITRATE 250 MCG/5ML IV SOLN
INTRAVENOUS | Status: AC
  Filled 2019-05-12: qty 5

## 2019-05-12 SURGICAL SUPPLY — 142 items
ADAPTER CARDIO PERF ANTE/RETRO (ADAPTER) ×5 IMPLANT
APPLIER CLIP 9.375 SM OPEN (CLIP) ×5
BAG DECANTER FOR FLEXI CONT (MISCELLANEOUS) ×5 IMPLANT
BASKET HEART  (ORDER IN 25'S) (MISCELLANEOUS) ×1
BASKET HEART (ORDER IN 25'S) (MISCELLANEOUS) ×1
BASKET HEART (ORDER IN 25S) (MISCELLANEOUS) ×3 IMPLANT
BLADE CLIPPER SURG (BLADE) ×5 IMPLANT
BLADE STERNUM SYSTEM 6 (BLADE) ×5 IMPLANT
BLADE SURG 11 STRL SS (BLADE) ×5 IMPLANT
BLADE SURG 15 STRL LF DISP TIS (BLADE) ×6 IMPLANT
BLADE SURG 15 STRL SS (BLADE) ×4
BNDG ELASTIC 4X5.8 VLCR STR LF (GAUZE/BANDAGES/DRESSINGS) ×10 IMPLANT
BNDG ELASTIC 6X5.8 VLCR STR LF (GAUZE/BANDAGES/DRESSINGS) ×5 IMPLANT
BNDG ESMARK 4X9 LF (GAUZE/BANDAGES/DRESSINGS) ×5 IMPLANT
BNDG GAUZE ELAST 4 BULKY (GAUZE/BANDAGES/DRESSINGS) ×10 IMPLANT
CANISTER SUCT 3000ML PPV (MISCELLANEOUS) ×5 IMPLANT
CANN PRFSN 3/8XRT ANG TPR 14 (MISCELLANEOUS) ×3
CANNULA EZ GLIDE AORTIC 21FR (CANNULA) ×5 IMPLANT
CANNULA GUNDRY RCSP 15FR (MISCELLANEOUS) ×5 IMPLANT
CANNULA PRFSN 3/8XRT ANG TPR14 (MISCELLANEOUS) ×3 IMPLANT
CANNULA SNGLE STGE 40FR VENOUS (CANNULA) IMPLANT
CANNULA SUMP PERICARDIAL (CANNULA) ×5 IMPLANT
CANNULA VEN MTL TIP RT (MISCELLANEOUS) ×2
CANNULA VENOUS MALLSGL STG 38 (MISCELLANEOUS) ×3 IMPLANT
CANNULAE VENOUS MALLSGL STG 38 (MISCELLANEOUS) ×5
CATH CPB KIT HENDRICKSON (MISCELLANEOUS) ×5 IMPLANT
CATH ROBINSON RED A/P 18FR (CATHETERS) ×20 IMPLANT
CATH THORACIC 36FR (CATHETERS) ×5 IMPLANT
CATH THORACIC 36FR RT ANG (CATHETERS) ×5 IMPLANT
CLIP APPLIE 9.375 SM OPEN (CLIP) ×3 IMPLANT
CLIP FOGARTY SPRING 6M (CLIP) ×5 IMPLANT
CLIP VESOCCLUDE MED 24/CT (CLIP) ×5 IMPLANT
CLIP VESOCCLUDE SM WIDE 24/CT (CLIP) ×15 IMPLANT
CONN 1/2X1/2X1/2  BEN (MISCELLANEOUS) ×2
CONN 1/2X1/2X1/2 BEN (MISCELLANEOUS) ×3 IMPLANT
CONN 3/8X1/2 ST GISH (MISCELLANEOUS) ×15 IMPLANT
CONN ST 1/2X1/2  BEN (MISCELLANEOUS) ×4
CONN ST 1/2X1/2 BEN (MISCELLANEOUS) ×6 IMPLANT
COUNTER NEEDLE 20 DBL MAG RED (NEEDLE) ×5 IMPLANT
COVER MAYO STAND STRL (DRAPES) ×15 IMPLANT
COVER WAND RF STERILE (DRAPES) IMPLANT
CUFF TOURN SGL QUICK 18X4 (TOURNIQUET CUFF) ×5 IMPLANT
DEFOGGER ANTIFOG KIT (MISCELLANEOUS) ×5 IMPLANT
DRAPE CARDIOVASCULAR INCISE (DRAPES) ×2
DRAPE EXTREMITY T 121X128X90 (DISPOSABLE) ×5 IMPLANT
DRAPE HALF SHEET 40X57 (DRAPES) ×10 IMPLANT
DRAPE SLUSH/WARMER DISC (DRAPES) ×5 IMPLANT
DRAPE SRG 135X102X78XABS (DRAPES) ×3 IMPLANT
DRSG COVADERM 4X10 (GAUZE/BANDAGES/DRESSINGS) ×5 IMPLANT
DRSG COVADERM 4X14 (GAUZE/BANDAGES/DRESSINGS) ×5 IMPLANT
ELECT BLADE 4.0 EZ CLEAN MEGAD (MISCELLANEOUS) ×5
ELECT REM PT RETURN 9FT ADLT (ELECTROSURGICAL) ×10
ELECTRODE BLDE 4.0 EZ CLN MEGD (MISCELLANEOUS) ×3 IMPLANT
ELECTRODE REM PT RTRN 9FT ADLT (ELECTROSURGICAL) ×6 IMPLANT
FELT TEFLON 1X6 (MISCELLANEOUS) ×5 IMPLANT
GAUZE SPONGE 4X4 12PLY STRL (GAUZE/BANDAGES/DRESSINGS) ×15 IMPLANT
GEL ULTRASOUND 20GR AQUASONIC (MISCELLANEOUS) IMPLANT
GLOVE BIO SURGEON STRL SZ 6 (GLOVE) ×5 IMPLANT
GLOVE BIO SURGEON STRL SZ 6.5 (GLOVE) ×20 IMPLANT
GLOVE BIO SURGEON STRL SZ7.5 (GLOVE) ×10 IMPLANT
GLOVE BIO SURGEONS STRL SZ 6.5 (GLOVE) ×5
GLOVE BIOGEL PI IND STRL 6.5 (GLOVE) ×6 IMPLANT
GLOVE BIOGEL PI INDICATOR 6.5 (GLOVE) ×4
GLOVE SURG SIGNA 7.5 PF LTX (GLOVE) ×15 IMPLANT
GLOVE SURG SS PI 6.0 STRL IVOR (GLOVE) ×15 IMPLANT
GLOVE SURG SS PI 7.5 STRL IVOR (GLOVE) ×5 IMPLANT
GOWN STRL REUS W/ TWL LRG LVL3 (GOWN DISPOSABLE) ×30 IMPLANT
GOWN STRL REUS W/ TWL XL LVL3 (GOWN DISPOSABLE) ×6 IMPLANT
GOWN STRL REUS W/TWL LRG LVL3 (GOWN DISPOSABLE) ×20
GOWN STRL REUS W/TWL XL LVL3 (GOWN DISPOSABLE) ×4
HEMOSTAT POWDER SURGIFOAM 1G (HEMOSTASIS) ×15 IMPLANT
HEMOSTAT SURGICEL 2X14 (HEMOSTASIS) ×5 IMPLANT
INSERT FOGARTY XLG (MISCELLANEOUS) IMPLANT
KIT BASIN OR (CUSTOM PROCEDURE TRAY) ×5 IMPLANT
KIT DRAINAGE VACCUM ASSIST (KITS) ×5 IMPLANT
KIT SUCTION CATH 14FR (SUCTIONS) ×5 IMPLANT
KIT TURNOVER KIT B (KITS) ×5 IMPLANT
KIT VASOVIEW HEMOPRO 2 VH 4000 (KITS) ×10 IMPLANT
LINE VENT (MISCELLANEOUS) ×5 IMPLANT
LOOP VESSEL SUPERMAXI WHITE (MISCELLANEOUS) ×5 IMPLANT
MARKER GRAFT CORONARY BYPASS (MISCELLANEOUS) ×15 IMPLANT
NS IRRIG 1000ML POUR BTL (IV SOLUTION) ×40 IMPLANT
PACK E OPEN HEART (SUTURE) ×5 IMPLANT
PACK OPEN HEART (CUSTOM PROCEDURE TRAY) ×5 IMPLANT
PAD ARMBOARD 7.5X6 YLW CONV (MISCELLANEOUS) ×10 IMPLANT
PAD ELECT DEFIB RADIOL ZOLL (MISCELLANEOUS) ×5 IMPLANT
PENCIL BUTTON HOLSTER BLD 10FT (ELECTRODE) ×10 IMPLANT
POSITIONER HEAD DONUT 9IN (MISCELLANEOUS) ×5 IMPLANT
PUNCH AORTIC ROTATE  4.5MM 8IN (MISCELLANEOUS) ×5 IMPLANT
PUNCH AORTIC ROTATE 4.0MM (MISCELLANEOUS) IMPLANT
PUNCH AORTIC ROTATE 4.5MM 8IN (MISCELLANEOUS) IMPLANT
PUNCH AORTIC ROTATE 5MM 8IN (MISCELLANEOUS) IMPLANT
SET CARDIOPLEGIA MPS 5001102 (MISCELLANEOUS) ×5 IMPLANT
SHEARS HARMONIC 9CM CVD (BLADE) IMPLANT
SHEARS HARMONIC STRL 23CM (MISCELLANEOUS) IMPLANT
SOL ANTI FOG 6CC (MISCELLANEOUS) ×6 IMPLANT
SOLUTION ANTI FOG 6CC (MISCELLANEOUS) ×4
SPONGE LAP 18X18 RF (DISPOSABLE) ×15 IMPLANT
SPONGE LAP 4X18 RFD (DISPOSABLE) ×5 IMPLANT
SUT BONE WAX W31G (SUTURE) ×5 IMPLANT
SUT ETHIBOND 2 0 SH (SUTURE) ×8
SUT ETHIBOND 2 0 SH 36X2 (SUTURE) ×12 IMPLANT
SUT ETHIBOND 4 0 RB 1 (SUTURE) ×5 IMPLANT
SUT MNCRL AB 4-0 PS2 18 (SUTURE) ×10 IMPLANT
SUT PROLENE 3 0 SH DA (SUTURE) ×15 IMPLANT
SUT PROLENE 4 0 RB 1 (SUTURE) ×22
SUT PROLENE 4 0 SH DA (SUTURE) ×25 IMPLANT
SUT PROLENE 4-0 RB1 .5 CRCL 36 (SUTURE) ×33 IMPLANT
SUT PROLENE 6 0 C 1 30 (SUTURE) ×5 IMPLANT
SUT PROLENE 7 0 BV 1 (SUTURE) ×10 IMPLANT
SUT PROLENE 7 0 BV1 MDA (SUTURE) ×5 IMPLANT
SUT PROLENE 8 0 BV175 6 (SUTURE) ×10 IMPLANT
SUT SILK 2 0 (SUTURE) ×2
SUT SILK 2 0 SH CR/8 (SUTURE) ×5 IMPLANT
SUT SILK 2-0 18XBRD TIE 12 (SUTURE) ×3 IMPLANT
SUT SILK 4 0 TIE 10X30 (SUTURE) ×5 IMPLANT
SUT STEEL 6MS V (SUTURE) ×5 IMPLANT
SUT STEEL STERNAL CCS#1 18IN (SUTURE) IMPLANT
SUT STEEL SZ 6 DBL 3X14 BALL (SUTURE) ×5 IMPLANT
SUT VIC AB 1 CTX 27 (SUTURE) ×10 IMPLANT
SUT VIC AB 1 CTX 36 (SUTURE) ×4
SUT VIC AB 1 CTX36XBRD ANBCTR (SUTURE) ×6 IMPLANT
SUT VIC AB 2-0 CT1 27 (SUTURE) ×6
SUT VIC AB 2-0 CT1 TAPERPNT 27 (SUTURE) ×9 IMPLANT
SUT VIC AB 2-0 CTX 27 (SUTURE) IMPLANT
SUT VIC AB 3-0 SH 27 (SUTURE)
SUT VIC AB 3-0 SH 27X BRD (SUTURE) IMPLANT
SUT VIC AB 3-0 X1 27 (SUTURE) IMPLANT
SUT VICRYL 4-0 PS2 18IN ABS (SUTURE) IMPLANT
SUT VICRYL CTD 3-0 1X27 RB-1 (SUTURE) ×5
SUTURE VICRL CTD 3-0 1X27 RB-1 (SUTURE) ×3 IMPLANT
SYR 50ML SLIP (SYRINGE) IMPLANT
SYSTEM SAHARA CHEST DRAIN ATS (WOUND CARE) ×5 IMPLANT
TAPE CLOTH SURG 4X10 WHT LF (GAUZE/BANDAGES/DRESSINGS) ×10 IMPLANT
TAPE PAPER 2X10 WHT MICROPORE (GAUZE/BANDAGES/DRESSINGS) ×5 IMPLANT
TOWEL GREEN STERILE (TOWEL DISPOSABLE) ×5 IMPLANT
TOWEL GREEN STERILE FF (TOWEL DISPOSABLE) ×5 IMPLANT
TRAY FOLEY SLVR 16FR TEMP STAT (SET/KITS/TRAYS/PACK) ×5 IMPLANT
TUBING LAP HI FLOW INSUFFLATIO (TUBING) ×10 IMPLANT
UNDERPAD 30X30 (UNDERPADS AND DIAPERS) ×5 IMPLANT
WATER STERILE IRR 1000ML POUR (IV SOLUTION) ×10 IMPLANT
YANKAUER SUCT BULB TIP NO VENT (SUCTIONS) ×5 IMPLANT

## 2019-05-12 NOTE — Anesthesia Procedure Notes (Addendum)
Central Venous Catheter Insertion Performed by: Oleta Mouse, MD, anesthesiologist Start/End10/16/2020 8:07 AM, 05/12/2019 8:07 AM Patient location: Pre-op. Preanesthetic checklist: patient identified, IV checked, site marked, risks and benefits discussed, surgical consent, monitors and equipment checked, pre-op evaluation, timeout performed and anesthesia consent Lidocaine 1% used for infiltration and patient sedated Hand hygiene performed  and maximum sterile barriers used  Catheter size: 8.5 Fr Sheath introducer Procedure performed using ultrasound guided technique. Ultrasound Notes:anatomy identified, needle tip was noted to be adjacent to the nerve/plexus identified, no ultrasound evidence of intravascular and/or intraneural injection and image(s) printed for medical record Attempts: 1 Following insertion, line sutured and dressing applied. Post procedure assessment: blood return through all ports, free fluid flow and no air  Patient tolerated the procedure well with no immediate complications.

## 2019-05-12 NOTE — Op Note (Signed)
NAME: Donald Hendrix, Donald M. MEDICAL RECORD WU:[id number] ACCOUNT [id number] DATE OF BIRTH:08-Mar-1951 FACILITY: [facility] LOCATION: [facility] PHYSICIAN:Brallan Denio Lars Pinks. Madelein Mahadeo, MD  OPERATIVE REPORT  DATE OF PROCEDURE:  05/12/2019  PREOPERATIVE DIAGNOSES:  Three-vessel coronary disease with exertional angina.  POSTOPERATIVE DIAGNOSES:  Three-vessel coronary disease with exertional angina. Moderately severe mitral regurgitation secondary to P3 prolapse.  PROCEDURE:  Median sternotomy, extracorporeal circulation, Coronary artery bypass grafting x 4  Left internal mammary artery to left anterior descending,  Left radial artery to OM3,  Saphenous vein graft to OM1,  Saphenous vein graft to first diagonal, Endoscopic left radial artery harvest, Endoscopic saphenous vein harvest right thigh, Mitral valve repair- Edge to edge repair of P3 prolapse.  SURGEON:  Charlett LangoSteven Laynee Lockamy, MD  ASSISTANT:  Gershon CraneWayne Gold, PA-C  SECOND ASSISTANT:  Jari Favreessa Conte, PA-C   ANESTHESIA:  General.  FINDINGS:  Transesophageal echocardiography revealed preserved left ventricular wall motion.  There was moderate to moderately severe mitral regurgitation with an anteriorly directed jet due to a prolapse of the P3 segment of the posterior leaflet of the  mitral valve.   Good quality conduits.  Mammary harvest difficult due to body habitus.  Good quality targets.  No graftable target in the distal right distribution.   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  No residual mitral regurgitation post-bypass.  CLINICAL NOTE:  The patient is a 68 year old gentleman with multiple cardiac risk factors who has been experiencing chest tightness with exertion.  A stress Myoview showed inferior and apical ischemia.  Echocardiogram showed aortic sclerosis, but no  significant stenosis.  He underwent cardiac catheterization which revealed left main and 3-vessel coronary disease.  He was advised to undergo coronary artery  bypass grafting.  The indications, risks, benefits, and alternatives were discussed in detail  with the patient.  He understood and accepted the risks and agreed to proceed.  OPERATIVE NOTE:  [name] was brought to the preoperative holding area on 05/12/2019.  The anesthesia service, under the direction of Dr. Val Eaglehristopher Moser, placed a Swan-Ganz catheter and an arterial blood pressure monitor line.  He was taken to the  operating room, anesthetized, and intubated.  Intravenous antibiotics were administered.  A Foley catheter was placed.  Transesophageal echocardiography was performed.  It revealed preserved left ventricular function.  There was significant mitral  regurgitation, moderate to moderately severe due to prolapse of the P3 segment of the posterior leaflet.  The jet was directed anteriorly across the mitral valve.  The decision was made to proceed with mitral valve repair in addition to the coronary  bypass grafting.  Allen's test was confirmed to be normal.  The chest, abdomen, legs, and left arm then were prepped and draped in the usual sterile fashion.  A timeout was performed.  The conduits were harvested simultaneously.  A median sternotomy was performed, and the left internal mammary artery was harvested using standard technique.  This was extremely difficult due to the patient's body habitus.  An  incision was made in the volar aspect of the left wrist over the radial artery.  The radial artery was mobilized, and then the radial artery was harvested endoscopically.  The saphenous vein was exposed at the knee and harvested endoscopically from the  right thigh.  Two thousand units of heparin was administered during the vessel harvest.  The remainder of the full heparin dose was given after the conduits were harvested.  The left arm was wrapped and tucked to the patient's side.  The  tourniquet time on the left arm during the radial harvest was 38 min.  A sternal retractor was placed and  was opened gently over time. After confirming adequate anticoagulation with ACT measurement, the aorta was cannulated via concentric 2-0 Ethibond pledgeted pursestring sutures.  A 31-French right angle venous cannula was placed via a pursestring suture in the superior vena cava.   Cardiopulmonary bypass was initiated.  A 38-French malleable cannula was placed via pursestring suture in the inferior aspect of the right atrium and directed into the inferior vena cava.  Caval tapes were placed but only tightened during the mitral  portion of the procedure.  The coronary arteries were inspected and anastomotic sites were chosen.  A foam pad was placed in the pericardium to insulate the heart.  A temperature probe was placed in the myocardial septum.  A retrograde cardioplegia  cannula was placed via pursestring suture in the right atrium and directed into the coronary sinus.  An antegrade cardioplegia cannula was placed in the ascending aorta.  The aorta was crossclamped.  The left ventricle was emptied via the aortic root vent.  Cardiac arrest was achieved with a combination of cold antegrade and retrograde blood cardioplegia and topical iced saline.  500 mL of antegrade cardioplegia was  administered.  Initially, there was a rapid diastolic arrest.  An additional 750 mL of cardioplegia was administered retrograde, and there was septal cooling to 10 degrees Celsius.  A reversed saphenous vein graft was placed end-to-side to the first obtuse marginal branch of the left circumflex.  This was a relatively high anterolateral branch.  It was a 1.5 mm good quality target.  The vein was large caliber, but good quality.  It  was anastomosed end-to-side with a running 7-0 Prolene suture.  Cardioplegia was administered down the vein graft.  There was good flow and good hemostasis.  A reversed saphenous vein graft was placed end-to-side to the largest diagonal branch of the LAD.  This was a 1.5 mm vessel.  It was of  good quality at the site of anastomosis.  There was heavy calcification proximally.  The vein was of good quality.  An  end-to-side anastomosis was performed with a running 7-0 Prolene suture, and there was good flow and good hemostasis with cardioplegia administration.  Additional cardioplegia was administered retrograde.  The posterior lateral wall of the heart was exposed.  The dominant posterolateral branch was OM3.  There was no graftable branch of the right circulation.  The radial artery was bevelled and  anastomosed end-to-side to the OM3 with a running 8-0 Prolene suture.  This was a 1.5 mm target.  At the completion of the graft, there was good flow through the graft.  The left internal mammary artery was brought through a window in the pericardium.  The distal end was bevelled.  It was anastomosed end-to-side to the distal LAD.  The LAD was a 1.5 mm good quality target.  It was heavily calcified proximal to the  anastomosis.  The mammary was a good quality conduit.  An end-to-side anastomosis was performed with a running 8-0 Prolene suture.  At the completion of the anastomosis, the bulldog clamp was briefly removed to inspect for hemostasis.  Rapid septal  rewarming was noted.  The bulldog clamp was replaced.  The mammary pedicle was tacked to the epicardial surface of the heart with 6-0 Prolene sutures.  Additional cardioplegia was administered retrograde.  The interatrial groove was dissected out and a left atriotomy was performed.  A mitral retractor was placed to expose the mitral valve.  This was very deep and difficult to reach, but adequate  exposure was obtained.  There was a single ruptured cord of the P3 leaflet causing it to prolapse.  This was very close to the commissure and was repaired by tacking the P3 leaflet to the A3 leaflet with two 4-0 Ethibond figure-of-eight sutures.   After doing so, the heart was distended with saline and the mitral valve was competent.  There was  good apposition of the leaflets.  There was no annular dilatation.  Rewarming was begun.  The left atriotomy was closed with 2 layers of running 4-0 Prolene suture.  The first was a horizontal mattress suture, followed a by simple suture.  After completing the first row of sutures, deairing was performed and then the  second row was done.  The vein grafts were cut to length.  The proximal vein graft anastomoses were performed to 4.5 mm punch aortotomies with running 6-0 Prolene sutures.  At the completion of the second vein graft anastomosis, a warm dose of retrograde cardioplegia was administered and the bulldog clamp was removed from the left  mammary artery.  The aortic root and left ventricle were deaired, and the aortic crossclamp was removed.  The total crossclamp time  was 116 minutes.  The patient initially fibrillated but converted to sinus rhythm spontaneously.  Bulldog clamps were placed proximally and distally on the OM vein graft, and a longitudinal venotomy was made.  The proximal end of the radial artery was  bevelled and was anastomosed end-to-side to the vein graft with a running 7-0 Prolene suture.  The graft was deaired by opening the distal clamp first before restoring flow by removing the proximal clamp.  While rewarming was completed, all proximal and  distal anastomoses were inspected for hemostasis.  The superior vena caval cannula was redirected into the right atrium, and the inferior vena caval cannula was removed.  Bleeding was noted from the posterior aspect of the superior vena cava, and a 4-0  Prolene suture was used to repair a small tear in the cava posteriorly.  When the patient had rewarmed to a core temperature of 37 degrees Celsius, he was weaned from cardiopulmonary bypass on the first attempt.  Total bypass time was 209 minutes.  The  initial cardiac index was greater than 2 L/min per sq m.  Post-bypass transesophageal echocardiography revealed preserved left  ventricular function.  The mitral regurgitation was corrected.  No residual air was noted in the cardiac chambers.  A test dose of protamine was administered, and the superior vena caval and aortic cannulae were removed.  The remainder of the protamine was administered without incident.  The chest was copiously irrigated with warm saline.  The pericardium could not be  reapproximated.  Left pleural and mediastinal chest tubes were placed through separate subcostal incisions and secured with 0 silk sutures.  The sternum was closed with a combination of single and double heavy-gauge stainless steel wires.  After closure  of the sternum, there was a drop in cardiac index without any change in rate and rhythm, blood pressure, or pulmonary arterial pressures.  A low-dose dopamine infusion was initiated, and the patient responded appropriately to volume administration.  The  pectoralis fascia, subcutaneous tissue, and skin were closed in standard fashion.  The leg incision was closed once the protamine had been given.  The chest tubes were placed to suction.  The patient was taken from  the operating room to the surgical  intensive care unit intubated and in good condition.  LN/NUANCE  D:05/12/2019 T:05/12/2019 JOB:[id number]

## 2019-05-12 NOTE — Brief Op Note (Signed)
05/12/2019  3:45 PM  PATIENT:  [name]  68 y.o. male  PRE-OPERATIVE DIAGNOSIS:  CAD  POST-OPERATIVE DIAGNOSIS:  CAD  PROCEDURE:  Procedure(s): CORONARY ARTERY BYPASS GRAFTING (CABG) x 4 WITH ENDOSCOPIC HARVESTING OF RIGHT GREATER SAPHENOUS VEIN (N/A) RADIAL ARTERY HARVEST (Left) TRANSESOPHAGEAL ECHOCARDIOGRAM (TEE) (N/A) Mitral Valve Repair (Mvr) (N/A)  SURGEON:  Surgeon(s) and Role:    * [name], MD - Primary  PHYSICIAN ASSISTANT: WAYNE GOLD PA-C; TESSA CONTE PA-C  ANESTHESIA:   general  EBL:  1100 mL   BLOOD ADMINISTERED:none  DRAINS: LEFT PLEURAL AND MEDIASTINAL  LOCAL MEDICATIONS USED:  NONE  SPECIMEN:  No Specimen  DISPOSITION OF SPECIMEN:  N/A  COUNTS:  YES  TOURNIQUET:   Total Tourniquet Time Documented: Upper Arm (Left) - 38 minutes Total: Upper Arm (Left) - 38 minutes   DICTATION: .Other Dictation: Dictation Number PENDING  PLAN OF CARE: Admit to inpatient   PATIENT DISPOSITION:  ICU - intubated and hemodynamically stable.   Delay start of Pharmacological VTE agent (>24hrs) due to surgical blood loss or risk of bleeding: yes

## 2019-05-12 NOTE — Transfer of Care (Signed)
Immediate Anesthesia Transfer of Care Note  Patient: TAJE LITTLER  Procedure(s) Performed: CORONARY ARTERY BYPASS GRAFTING (CABG) x 4 WITH ENDOSCOPIC HARVESTING OF RIGHT GREATER SAPHENOUS VEIN (N/A Chest) RADIAL ARTERY HARVEST (Left ) TRANSESOPHAGEAL ECHOCARDIOGRAM (TEE) (N/A ) Mitral Valve Repair (Mvr) (N/A )  Patient Location: ICU  Anesthesia Type:General  Level of Consciousness: unresponsive and Patient remains intubated per anesthesia plan  Airway & Oxygen Therapy: Patient remains intubated per anesthesia plan and Patient placed on Ventilator (see vital sign flow sheet for setting)  Post-op Assessment: Report given to RN and Post -op Vital signs reviewed and stable  Post vital signs: Reviewed  Last Vitals:  Vitals Value Taken Time  BP    Temp    Pulse    Resp 85 05/12/19 1522  SpO2    Vitals shown include unvalidated device data.  Last Pain:  Vitals:   05/12/19 0657  TempSrc: Oral  PainSc:       Patients Stated Pain Goal: 3 (66/44/03 4742)  Complications: No apparent anesthesia complications

## 2019-05-12 NOTE — Procedures (Signed)
Extubation Procedure Note  Patient Details:   Name: Donald Hendrix DOB: 03-24-1951 MRN: [id number]   Airway Documentation:    Vent end date: 05/12/19 Vent end time: 1825   Evaluation  O2 sats: stable throughout Complications: No apparent complications Patient did tolerate procedure well. Bilateral Breath Sounds: Clear   Yes, pt could speak post-extubation.  Pt extubated to 4 l/m [place]hler per protocol.  Earney Navy 05/12/2019, 6:27 PM

## 2019-05-12 NOTE — Anesthesia Procedure Notes (Signed)
Procedure Name: Intubation Date/Time: 05/12/2019 7:52 AM Performed by: Moshe Salisbury, CRNA Pre-anesthesia Checklist: Patient identified, Emergency Drugs available, Suction available and Patient being monitored Patient Re-evaluated:Patient Re-evaluated prior to induction Oxygen Delivery Method: Circle System Utilized Preoxygenation: Pre-oxygenation with 100% oxygen Induction Type: IV induction Ventilation: Mask ventilation with difficulty, Two handed mask ventilation required and Oral airway inserted - appropriate to patient size Laryngoscope Size: Mac and 4 Grade View: Grade I Tube type: Oral Tube size: 8.0 mm Number of attempts: 1 Airway Equipment and Method: Stylet and Oral airway Placement Confirmation: ETT inserted through vocal cords under direct vision,  positive ETCO2 and breath sounds checked- equal and bilateral Secured at: 23 cm Tube secured with: Tape Dental Injury: Teeth and Oropharynx as per pre-operative assessment

## 2019-05-12 NOTE — Anesthesia Preprocedure Evaluation (Addendum)
Anesthesia Evaluation  Patient identified by MRN, date of birth, ID band Patient awake    Reviewed: Allergy & Precautions, NPO status , Patient's Chart, lab work & pertinent test results  History of Anesthesia Complications Negative for: history of anesthetic complications  Airway Mallampati: III  TM Distance: >3 FB Neck ROM: Full    Dental  (+) Teeth Intact, Dental Advisory Given,    Pulmonary neg pulmonary ROS, neg recent URI,    breath sounds clear to auscultation       Cardiovascular hypertension, Pt. on medications and Pt. on home beta blockers + angina with exertion + CAD  (-) CHF  Rhythm:Regular     Neuro/Psych negative neurological ROS  negative psych ROS   GI/Hepatic Neg liver ROS, GERD  ,  Endo/Other  Morbid obesity  Renal/GU negative Renal ROS     Musculoskeletal  (+) Arthritis ,   Abdominal   Peds  Hematology negative hematology ROS (+)   Anesthesia Other Findings   Reproductive/Obstetrics                            Anesthesia Physical Anesthesia Plan  ASA: IV  Anesthesia Plan: General   Post-op Pain Management:    Induction: Intravenous  PONV Risk Score and Plan: 2 and Ondansetron and Midazolam  Airway Management Planned: Oral ETT  Additional Equipment: Arterial line, CVP, PA Cath, TEE and Ultrasound Guidance Line Placement  Intra-op Plan:   Post-operative Plan: Post-operative intubation/ventilation  Informed Consent: I have reviewed the patients History and Physical, chart, labs and discussed the procedure including the risks, benefits and alternatives for the proposed anesthesia with the patient or authorized representative who has indicated his/her understanding and acceptance.     Dental advisory given  Plan Discussed with: CRNA, Anesthesiologist and Surgeon  Anesthesia Plan Comments:         Anesthesia Quick Evaluation

## 2019-05-12 NOTE — Anesthesia Procedure Notes (Signed)
Arterial Line Insertion Start/End10/16/2020 7:05 AM, 05/12/2019 7:20 AM Performed by: Moshe Salisbury, CRNA, CRNA  Preanesthetic checklist: patient identified, IV checked, site marked, risks and benefits discussed, surgical consent, monitors and equipment checked, pre-op evaluation, timeout performed and anesthesia consent Lidocaine 1% used for infiltration and patient sedated Right, radial was placed Catheter size: 20 G Hand hygiene performed , maximum sterile barriers used  and Seldinger technique used  Attempts: 2 Procedure performed without using ultrasound guided technique. Following insertion, Biopatch and dressing applied. Post procedure assessment: normal  Patient tolerated the procedure well with no immediate complications.

## 2019-05-12 NOTE — Interval H&P Note (Signed)
History and Physical Interval Note:  05/12/2019 7:16 AM  Donald Hendrix  has presented today for surgery, with the diagnosis of CAD.  The various methods of treatment have been discussed with the patient and family. After consideration of risks, benefits and other options for treatment, the patient has consented to  Procedure(s): CORONARY ARTERY BYPASS GRAFTING (CABG) (N/A) RADIAL ARTERY HARVEST (Left) TRANSESOPHAGEAL ECHOCARDIOGRAM (TEE) (N/A) as a surgical intervention.  The patient's history has been reviewed, patient examined, no change in status, stable for surgery.  I have reviewed the patient's chart and labs.  Questions were answered to the patient's satisfaction.     [name]

## 2019-05-12 NOTE — Progress Notes (Signed)
CT surgery p.m. Rounds  Hemodynamically stable after multivessel CABG and mitral valve commissuroplasty Extubated, neuro intact Left arm warm after radial artery harvest Minimal chest tube output Doing well Continue IV dopamine until a.m. rounds

## 2019-05-12 NOTE — Progress Notes (Signed)
  Echocardiogram Echocardiogram Transesophageal has been performed.  Bobbye Charleston 05/12/2019, 9:19 AM

## 2019-05-13 ENCOUNTER — Inpatient Hospital Stay (HOSPITAL_COMMUNITY): Payer: BC Managed Care – PPO

## 2019-05-13 LAB — BASIC METABOLIC PANEL
Anion gap: 8 (ref 5–15)
Anion gap: 11 (ref 5–15)
BUN: 12 mg/dL (ref 8–23)
BUN: 19 mg/dL (ref 8–23)
CO2: 22 mmol/L (ref 22–32)
CO2: 23 mmol/L (ref 22–32)
Calcium: 8 mg/dL — ABNORMAL LOW (ref 8.9–10.3)
Calcium: 8.1 mg/dL — ABNORMAL LOW (ref 8.9–10.3)
Chloride: 108 mmol/L (ref 98–111)
Chloride: 102 mmol/L (ref 98–111)
Creatinine, Ser: 1.06 mg/dL (ref 0.61–1.24)
Creatinine, Ser: 1.4 mg/dL — ABNORMAL HIGH (ref 0.61–1.24)
GFR calc Af Amer: >60 mL/min (ref >60)
GFR calc Af Amer: >60 mL/min (ref >60)
GFR calc non Af Amer: >60 mL/min (ref >60)
GFR calc non Af Amer: 52 mL/min — ABNORMAL LOW (ref >60)
Glucose, Bld: 126 mg/dL — ABNORMAL HIGH (ref 70–99)
Glucose, Bld: 131 mg/dL — ABNORMAL HIGH (ref 70–99)
Potassium: 4.3 mmol/L (ref 3.5–5.1)
Potassium: 4.3 mmol/L (ref 3.5–5.1)
Sodium: 138 mmol/L (ref 135–145)
Sodium: 136 mmol/L (ref 135–145)

## 2019-05-13 LAB — CBC
HCT: 34.6 % — ABNORMAL LOW (ref 39.0–52.0)
HCT: 32.3 % — ABNORMAL LOW (ref 39.0–52.0)
Hemoglobin: 11.5 g/dL — ABNORMAL LOW (ref 13.0–17.0)
Hemoglobin: 11.1 g/dL — ABNORMAL LOW (ref 13.0–17.0)
MCH: 30.2 pg (ref 26.0–34.0)
MCH: 31.5 pg (ref 26.0–34.0)
MCHC: 33.2 g/dL (ref 30.0–36.0)
MCHC: 34.4 g/dL (ref 30.0–36.0)
MCV: 90.8 fL (ref 80.0–100.0)
MCV: 91.8 fL (ref 80.0–100.0)
Platelets: 172 10*3/uL (ref 150–400)
Platelets: 152 10*3/uL (ref 150–400)
RBC: 3.81 MIL/uL — ABNORMAL LOW (ref 4.22–5.81)
RBC: 3.52 MIL/uL — ABNORMAL LOW (ref 4.22–5.81)
RDW: 13.2 % (ref 11.5–15.5)
RDW: 13.3 % (ref 11.5–15.5)
WBC: 14.7 10*3/uL — ABNORMAL HIGH (ref 4.0–10.5)
WBC: 14.7 10*3/uL — ABNORMAL HIGH (ref 4.0–10.5)
nRBC: 0 % (ref 0.0–0.2)
nRBC: 0 % (ref 0.0–0.2)

## 2019-05-13 LAB — GLUCOSE, CAPILLARY
Glucose-Capillary: 109 mg/dL — ABNORMAL HIGH (ref 70–99)
Glucose-Capillary: 109 mg/dL — ABNORMAL HIGH (ref 70–99)
Glucose-Capillary: 120 mg/dL — ABNORMAL HIGH (ref 70–99)
Glucose-Capillary: 124 mg/dL — ABNORMAL HIGH (ref 70–99)
Glucose-Capillary: 126 mg/dL — ABNORMAL HIGH (ref 70–99)
Glucose-Capillary: 127 mg/dL — ABNORMAL HIGH (ref 70–99)
Glucose-Capillary: 127 mg/dL — ABNORMAL HIGH (ref 70–99)
Glucose-Capillary: 128 mg/dL — ABNORMAL HIGH (ref 70–99)
Glucose-Capillary: 129 mg/dL — ABNORMAL HIGH (ref 70–99)
Glucose-Capillary: 132 mg/dL — ABNORMAL HIGH (ref 70–99)
Glucose-Capillary: 133 mg/dL — ABNORMAL HIGH (ref 70–99)
Glucose-Capillary: 140 mg/dL — ABNORMAL HIGH (ref 70–99)
Glucose-Capillary: 163 mg/dL — ABNORMAL HIGH (ref 70–99)

## 2019-05-13 LAB — MAGNESIUM
Magnesium: 2.5 mg/dL — ABNORMAL HIGH (ref 1.7–2.4)
Magnesium: 2.3 mg/dL (ref 1.7–2.4)

## 2019-05-13 MED ORDER — INSULIN DETEMIR 100 UNIT/ML ~~LOC~~ SOLN
8.0000 [IU] | Freq: Two times a day (BID) | SUBCUTANEOUS | Status: DC
  Administered 2019-05-13 – 2019-05-14 (×3): 8 [IU] via SUBCUTANEOUS
  Filled 2019-05-13 (×4): qty 0.08

## 2019-05-13 MED ORDER — FUROSEMIDE 10 MG/ML IJ SOLN
20.0000 mg | Freq: Four times a day (QID) | INTRAMUSCULAR | Status: DC
  Administered 2019-05-13 – 2019-05-14 (×5): 20 mg via INTRAVENOUS
  Filled 2019-05-13 (×5): qty 2

## 2019-05-13 MED ORDER — INSULIN ASPART 100 UNIT/ML ~~LOC~~ SOLN
0.0000 [IU] | SUBCUTANEOUS | Status: DC
  Administered 2019-05-13 – 2019-05-14 (×2): 2 [IU] via SUBCUTANEOUS

## 2019-05-13 MED ORDER — ENOXAPARIN SODIUM 40 MG/0.4ML ~~LOC~~ SOLN
40.0000 mg | Freq: Every day | SUBCUTANEOUS | Status: DC
  Administered 2019-05-13 – 2019-05-15 (×3): 40 mg via SUBCUTANEOUS
  Filled 2019-05-13 (×3): qty 0.4

## 2019-05-13 MED ORDER — ISOSORBIDE MONONITRATE ER 30 MG PO TB24
15.0000 mg | ORAL_TABLET | Freq: Every day | ORAL | Status: DC
  Administered 2019-05-13 – 2019-05-17 (×5): 15 mg via ORAL
  Filled 2019-05-13 (×5): qty 1

## 2019-05-13 NOTE — Progress Notes (Signed)
1 Day Post-Op Procedure(s) (LRB): CORONARY ARTERY BYPASS GRAFTING (CABG) x 4 WITH ENDOSCOPIC HARVESTING OF RIGHT GREATER SAPHENOUS VEIN (N/A) RADIAL ARTERY HARVEST (Left) TRANSESOPHAGEAL ECHOCARDIOGRAM (TEE) (N/A) Mitral Valve Repair (Mvr) (N/A) Subjective: Doing well A-paced 90, weaning drips lasix ordered for wt gain 14 ibs Objective: Vital signs in last 24 hours: Temp:  [99 F (37.2 C)-100.6 F (38.1 C)] 99 F (37.2 C) (10/17 0900) Pulse Rate:  [63-89] 89 (10/17 0900) Cardiac Rhythm: Atrial paced (10/17 0800) Resp:  [0-23] 18 (10/17 0900) BP: (76-104)/(34-78) 91/54 (10/17 0900) SpO2:  [83 %-100 %] 95 % (10/17 0900) Arterial Line BP: (88-150)/(50-90) 112/57 (10/17 0900) FiO2 (%):  [40 %-50 %] 40 % (10/16 1800) Weight:  [120.6 kg] 120.6 kg (10/17 0500)  Hemodynamic parameters for last 24 hours: PAP: (22-40)/(7-24) 24/13 CO:  [3.9 L/min-5.6 L/min] 5.6 L/min CI:  [1.7 L/min/m2-2.3 L/min/m2] 2.3 L/min/m2  Intake/Output from previous day: 10/16 0701 - 10/17 0700 In: 4959.6 [P.O.:600; I.V.:3417.1; Blood:600; IV Piggyback:342.5] Out: 0263 [Urine:3360; Blood:1100; Chest Tube:320] Intake/Output this shift: Total I/O In: 382 [P.O.:240; I.V.:142] Out: 230 [Urine:200; Chest Tube:30]       Exam    General- alert and comfortable    Neck- no JVD, no cervical adenopathy palpable, no carotid bruit   Lungs- clear without rales, wheezes   Cor- regular rate and rhythm, no murmur , gallop   Abdomen- soft, non-tender   Extremities - warm, non-tender, minimal edema   Neuro- oriented, appropriate, no focal weakness   Lab Results: Recent Labs    05/12/19 2115 05/13/19 0337  WBC 15.2* 14.7*  HGB 11.9* 11.5*  HCT 34.2* 34.6*  PLT 154 172   BMET:  Recent Labs    05/12/19 2115 05/13/19 0337  NA 138 138  K 3.9 4.3  CL 110 108  CO2 21* 22  GLUCOSE 136* 126*  BUN 13 12  CREATININE 1.11 1.06  CALCIUM 7.7* 8.0*    PT/INR:  Recent Labs    05/12/19 1533  LABPROT 18.0*   INR 1.5*   ABG    Component Value Date/Time   PHART 7.409 05/12/2019 2048   HCO3 21.1 05/12/2019 2048   TCO2 22 05/12/2019 2048   ACIDBASEDEF 3.0 (H) 05/12/2019 2048   O2SAT 97.0 05/12/2019 2048   CBG (last 3)  Recent Labs    05/13/19 0446 05/13/19 0545 05/13/19 0643  GLUCAP 133* 109* 128*    Assessment/Plan: S/P Procedure(s) (LRB): CORONARY ARTERY BYPASS GRAFTING (CABG) x 4 WITH ENDOSCOPIC HARVESTING OF RIGHT GREATER SAPHENOUS VEIN (N/A) RADIAL ARTERY HARVEST (Left) TRANSESOPHAGEAL ECHOCARDIOGRAM (TEE) (N/A) Mitral Valve Repair (Mvr) (N/A) Mobilize Diuresis Diabetes control d/c tubes/lines imdur for radial artery graft   LOS: 1 day    Donald Hendrix 05/13/2019

## 2019-05-13 NOTE — Progress Notes (Signed)
CT surgery p.m. Rounds Patient had excellent day Up in chair with minimal pain Sinus rhythm Drips weaned off Diuresing well P.m. labs reviewed and are satisfactory

## 2019-05-14 ENCOUNTER — Inpatient Hospital Stay (HOSPITAL_COMMUNITY): Payer: BC Managed Care – PPO

## 2019-05-14 LAB — BASIC METABOLIC PANEL
Anion gap: 10 (ref 5–15)
BUN: 22 mg/dL (ref 8–23)
CO2: 25 mmol/L (ref 22–32)
Calcium: 8 mg/dL — ABNORMAL LOW (ref 8.9–10.3)
Chloride: 101 mmol/L (ref 98–111)
Creatinine, Ser: 1.41 mg/dL — ABNORMAL HIGH (ref 0.61–1.24)
GFR calc Af Amer: 60 mL/min — ABNORMAL LOW (ref >60)
GFR calc non Af Amer: 52 mL/min — ABNORMAL LOW (ref >60)
Glucose, Bld: 118 mg/dL — ABNORMAL HIGH (ref 70–99)
Potassium: 4.1 mmol/L (ref 3.5–5.1)
Sodium: 136 mmol/L (ref 135–145)

## 2019-05-14 LAB — GLUCOSE, CAPILLARY
Glucose-Capillary: 108 mg/dL — ABNORMAL HIGH (ref 70–99)
Glucose-Capillary: 126 mg/dL — ABNORMAL HIGH (ref 70–99)
Glucose-Capillary: 115 mg/dL — ABNORMAL HIGH (ref 70–99)
Glucose-Capillary: 122 mg/dL — ABNORMAL HIGH (ref 70–99)
Glucose-Capillary: 109 mg/dL — ABNORMAL HIGH (ref 70–99)
Glucose-Capillary: 118 mg/dL — ABNORMAL HIGH (ref 70–99)
Glucose-Capillary: 130 mg/dL — ABNORMAL HIGH (ref 70–99)
Glucose-Capillary: 130 mg/dL — ABNORMAL HIGH (ref 70–99)

## 2019-05-14 LAB — CBC
HCT: 31.2 % — ABNORMAL LOW (ref 39.0–52.0)
Hemoglobin: 10.4 g/dL — ABNORMAL LOW (ref 13.0–17.0)
MCH: 30.6 pg (ref 26.0–34.0)
MCHC: 33.3 g/dL (ref 30.0–36.0)
MCV: 91.8 fL (ref 80.0–100.0)
Platelets: 132 10*3/uL — ABNORMAL LOW (ref 150–400)
RBC: 3.4 MIL/uL — ABNORMAL LOW (ref 4.22–5.81)
RDW: 13.5 % (ref 11.5–15.5)
WBC: 12.9 10*3/uL — ABNORMAL HIGH (ref 4.0–10.5)
nRBC: 0 % (ref 0.0–0.2)

## 2019-05-14 MED ORDER — POTASSIUM CHLORIDE CRYS ER 20 MEQ PO TBCR
20.0000 meq | EXTENDED_RELEASE_TABLET | Freq: Two times a day (BID) | ORAL | Status: DC
  Administered 2019-05-14 – 2019-05-16 (×6): 20 meq via ORAL
  Filled 2019-05-14 (×7): qty 1

## 2019-05-14 MED ORDER — INSULIN ASPART 100 UNIT/ML ~~LOC~~ SOLN: 0.0000 [IU] | Freq: Every day | SUBCUTANEOUS | Status: DC

## 2019-05-14 MED ORDER — INSULIN ASPART 100 UNIT/ML ~~LOC~~ SOLN: 0.0000 [IU] | Freq: Three times a day (TID) | SUBCUTANEOUS | Status: DC

## 2019-05-14 MED ORDER — FUROSEMIDE 10 MG/ML IJ SOLN
40.0000 mg | Freq: Every day | INTRAMUSCULAR | Status: DC
  Administered 2019-05-14: 20 mg via INTRAVENOUS
  Administered 2019-05-15 – 2019-05-16 (×2): 40 mg via INTRAVENOUS
  Filled 2019-05-14 (×4): qty 4

## 2019-05-14 NOTE — Progress Notes (Signed)
CT surgery p.m. Rounds  Patient's heart rate off pacemaker 60-65 sinus with good blood pressure We will DC Foley this p.m. Progressing very well after CABG-mitral valve repair Anticipate transfer to stepdown tomorrow

## 2019-05-14 NOTE — Progress Notes (Signed)
2 Days Post-Op Procedure(s) (LRB): CORONARY ARTERY BYPASS GRAFTING (CABG) x 4 WITH ENDOSCOPIC HARVESTING OF RIGHT GREATER SAPHENOUS VEIN (N/A) RADIAL ARTERY HARVEST (Left) TRANSESOPHAGEAL ECHOCARDIOGRAM (TEE) (N/A) Mitral Valve Repair (Mvr) (N/A) Subjective: Walked in hall Slow nsr - a-paced 80, hold lopressor Wt coming down, cont laasix   Objective Vital signs in last 24 hours: Temp:  [97.4 F (36.3 C)-98.9 F (37.2 C)] 97.4 F (36.3 C) (10/18 0817) Pulse Rate:  [87-90] 88 (10/18 0902) Cardiac Rhythm: Atrial paced (10/18 0826) Resp:  [14-27] 20 (10/18 0817) BP: (90-136)/(61-96) 123/83 (10/18 0902) SpO2:  [94 %-100 %] 100 % (10/18 0500) Weight:  [118.1 kg] 118.1 kg (10/18 0500)  Hemodynamic parameters for last 24 hours:    Intake/Output from previous day: 10/17 0701 - 10/18 0700 In: 910.4 [P.O.:240; I.V.:520.4; IV Piggyback:150] Out: 2770 [Urine:2510; Chest Tube:260] Intake/Output this shift: Total I/O In: 240 [P.O.:240] Out: 125 [Urine:125]       Exam    General- alert and comfortable    Neck- no JVD, no cervical adenopathy palpable, no carotid bruit   Lungs- clear without rales, wheezes   Cor- regular rate and rhythm, no murmur , gallop   Abdomen- soft, non-tender   Extremities - warm, non-tender, minimal edema   Neuro- oriented, appropriate, no focal weakness   Lab Results: Recent Labs    05/13/19 1705 05/14/19 0547  WBC 14.7* 12.9*  HGB 11.1* 10.4*  HCT 32.3* 31.2*  PLT 152 132*   BMET:  Recent Labs    05/13/19 1705 05/14/19 0547  NA 136 136  K 4.3 4.1  CL 102 101  CO2 23 25  GLUCOSE 131* 118*  BUN 19 22  CREATININE 1.40* 1.41*  CALCIUM 8.1* 8.0*    PT/INR:  Recent Labs    05/12/19 1533  LABPROT 18.0*  INR 1.5*   ABG    Component Value Date/Time   PHART 7.409 05/12/2019 2048   HCO3 21.1 05/12/2019 2048   TCO2 22 05/12/2019 2048   ACIDBASEDEF 3.0 (H) 05/12/2019 2048   O2SAT 97.0 05/12/2019 2048   CBG (last 3)  Recent Labs   05/13/19 2312 05/14/19 0317 05/14/19 0815  GLUCAP 126* 115* 122*    Assessment/Plan: S/P Procedure(s) (LRB): CORONARY ARTERY BYPASS GRAFTING (CABG) x 4 WITH ENDOSCOPIC HARVESTING OF RIGHT GREATER SAPHENOUS VEIN (N/A) RADIAL ARTERY HARVEST (Left) TRANSESOPHAGEAL ECHOCARDIOGRAM (TEE) (N/A) Mitral Valve Repair (Mvr) (N/A) Mobilize Diuresis Diabetes control d/c tubes/lines cont a-pacing   LOS: 2 days    Donald Hendrix 05/14/2019

## 2019-05-15 ENCOUNTER — Encounter (HOSPITAL_COMMUNITY): Payer: Self-pay | Admitting: Thoracic Surgery (Cardiothoracic Vascular Surgery)

## 2019-05-15 ENCOUNTER — Inpatient Hospital Stay (HOSPITAL_COMMUNITY): Payer: BC Managed Care – PPO

## 2019-05-15 LAB — CBC
HCT: 30 % — ABNORMAL LOW (ref 39.0–52.0)
Hemoglobin: 10 g/dL — ABNORMAL LOW (ref 13.0–17.0)
MCH: 30.7 pg (ref 26.0–34.0)
MCHC: 33.3 g/dL (ref 30.0–36.0)
MCV: 92 fL (ref 80.0–100.0)
Platelets: 169 10*3/uL (ref 150–400)
RBC: 3.26 MIL/uL — ABNORMAL LOW (ref 4.22–5.81)
RDW: 13.3 % (ref 11.5–15.5)
WBC: 10.8 10*3/uL — ABNORMAL HIGH (ref 4.0–10.5)
nRBC: 0 % (ref 0.0–0.2)

## 2019-05-15 LAB — COMPREHENSIVE METABOLIC PANEL
ALT: 32 U/L (ref 0–44)
AST: 45 U/L — ABNORMAL HIGH (ref 15–41)
Albumin: 3.1 g/dL — ABNORMAL LOW (ref 3.5–5.0)
Alkaline Phosphatase: 40 U/L (ref 38–126)
Anion gap: 10 (ref 5–15)
BUN: 28 mg/dL — ABNORMAL HIGH (ref 8–23)
CO2: 24 mmol/L (ref 22–32)
Calcium: 8 mg/dL — ABNORMAL LOW (ref 8.9–10.3)
Chloride: 103 mmol/L (ref 98–111)
Creatinine, Ser: 1.39 mg/dL — ABNORMAL HIGH (ref 0.61–1.24)
GFR calc Af Amer: >60 mL/min (ref >60)
GFR calc non Af Amer: 52 mL/min — ABNORMAL LOW (ref >60)
Glucose, Bld: 86 mg/dL (ref 70–99)
Potassium: 4.3 mmol/L (ref 3.5–5.1)
Sodium: 137 mmol/L (ref 135–145)
Total Bilirubin: 0.6 mg/dL (ref 0.3–1.2)
Total Protein: 5.6 g/dL — ABNORMAL LOW (ref 6.5–8.1)

## 2019-05-15 LAB — GLUCOSE, CAPILLARY
Glucose-Capillary: 102 mg/dL — ABNORMAL HIGH (ref 70–99)
Glucose-Capillary: 88 mg/dL (ref 70–99)
Glucose-Capillary: 97 mg/dL (ref 70–99)
Glucose-Capillary: 93 mg/dL (ref 70–99)

## 2019-05-15 MED ORDER — AMIODARONE HCL 200 MG PO TABS
400.0000 mg | ORAL_TABLET | Freq: Two times a day (BID) | ORAL | Status: DC
  Administered 2019-05-15 – 2019-05-17 (×5): 400 mg via ORAL
  Filled 2019-05-15 (×5): qty 2

## 2019-05-15 MED ORDER — MAGNESIUM HYDROXIDE 400 MG/5ML PO SUSP: 30.0000 mL | Freq: Every day | ORAL | Status: DC | PRN

## 2019-05-15 MED ORDER — SODIUM CHLORIDE 0.9 % IV SOLN: 250.0000 mL | INTRAVENOUS | Status: DC | PRN

## 2019-05-15 MED ORDER — LISINOPRIL 5 MG PO TABS
5.0000 mg | ORAL_TABLET | Freq: Every day | ORAL | Status: DC
  Administered 2019-05-15 – 2019-05-17 (×3): 5 mg via ORAL
  Filled 2019-05-15 (×3): qty 1

## 2019-05-15 MED ORDER — METOPROLOL SUCCINATE ER 25 MG PO TB24
25.0000 mg | ORAL_TABLET | Freq: Every day | ORAL | Status: DC
  Administered 2019-05-15 – 2019-05-17 (×3): 25 mg via ORAL
  Filled 2019-05-15 (×3): qty 1

## 2019-05-15 MED ORDER — SODIUM CHLORIDE 0.9% FLUSH: 3.0000 mL | INTRAVENOUS | Status: DC | PRN

## 2019-05-15 MED ORDER — ZOLPIDEM TARTRATE 5 MG PO TABS: 5.0000 mg | ORAL_TABLET | Freq: Every evening | ORAL | Status: DC | PRN

## 2019-05-15 MED ORDER — SODIUM CHLORIDE 0.9% FLUSH
3.0000 mL | Freq: Two times a day (BID) | INTRAVENOUS | Status: DC
  Administered 2019-05-15 – 2019-05-17 (×5): 3 mL via INTRAVENOUS

## 2019-05-15 MED ORDER — ALUM & MAG HYDROXIDE-SIMETH 200-200-20 MG/5ML PO SUSP: 15.0000 mL | Freq: Four times a day (QID) | ORAL | Status: DC | PRN

## 2019-05-15 MED ORDER — MOVING RIGHT ALONG BOOK: Freq: Once | Status: DC

## 2019-05-15 MED FILL — Magnesium Sulfate Inj 50%: INTRAMUSCULAR | Qty: 10 | Status: AC

## 2019-05-15 MED FILL — Heparin Sodium (Porcine) Inj 1000 Unit/ML: INTRAMUSCULAR | Qty: 30 | Status: AC

## 2019-05-15 MED FILL — Potassium Chloride Inj 2 mEq/ML: INTRAVENOUS | Qty: 40 | Status: AC

## 2019-05-15 NOTE — Progress Notes (Signed)
Patient ID: Donald Hendrix, male   DOB: 03/07/1951, 68 y.o.   MRN: [id number] EVENING ROUNDS NOTE :     [address]       [place] [address]             [phone]                 3 Days Post-Op Procedure(s) (LRB): CORONARY ARTERY BYPASS GRAFTING (CABG) x 4 WITH ENDOSCOPIC HARVESTING OF RIGHT GREATER SAPHENOUS VEIN (N/A) RADIAL ARTERY HARVEST (Left) TRANSESOPHAGEAL ECHOCARDIOGRAM (TEE) (N/A) Mitral Valve Repair (Mvr) (N/A)  Total Length of Stay:  LOS: 3 days  BP (!) 90/59   Pulse 90   Temp 98.4 F (36.9 C) (Oral)   Resp 14   Ht 6\' 1"  (1.854 m)   Wt 116.6 kg   SpO2 98%   BMI 33.91 kg/m   .Intake/Output      10/18 0701 - 10/19 0700 10/19 0701 - 10/20 0700   P.O. 360 480   I.V. (mL/kg)     IV Piggyback     Total Intake(mL/kg) 360 (3.1) 480 (4.1)   Urine (mL/kg/hr) 1275 (0.5) 500 (0.5)   Stool     Chest Tube 100    Total Output 1375 500   Net -1015 -20        Urine Occurrence  1 x     . sodium chloride       Lab Results  Component Value Date   WBC 10.8 (H) 05/15/2019   HGB 10.0 (L) 05/15/2019   HCT 30.0 (L) 05/15/2019   PLT 169 05/15/2019   GLUCOSE 86 05/15/2019   ALT 32 05/15/2019   AST 45 (H) 05/15/2019   NA 137 05/15/2019   K 4.3 05/15/2019   CL 103 05/15/2019   CREATININE 1.39 (H) 05/15/2019   BUN 28 (H) 05/15/2019   CO2 24 05/15/2019   TSH 1.380 04/21/2019   INR 1.5 (H) 05/12/2019   HGBA1C 5.3 05/09/2019   Now afib controlled rate On Cordarone    [name] MD  Beeper [phone] Office [phone] 05/15/2019 3:58 PM

## 2019-05-15 NOTE — Progress Notes (Signed)
3 Days Post-Op Procedure(s) (LRB): CORONARY ARTERY BYPASS GRAFTING (CABG) x 4 WITH ENDOSCOPIC HARVESTING OF RIGHT GREATER SAPHENOUS VEIN (N/A) RADIAL ARTERY HARVEST (Left) TRANSESOPHAGEAL ECHOCARDIOGRAM (TEE) (N/A) Mitral Valve Repair (Mvr) (N/A) Subjective: No complaints  Objective: Vital signs in last 24 hours: Temp:  [97.4 F (36.3 C)-99.2 F (37.3 C)] 98.7 F (37.1 C) (10/19 0400) Pulse Rate:  [61-139] 121 (10/19 0700) Cardiac Rhythm: Normal sinus rhythm (10/19 0400) Resp:  [13-21] 19 (10/19 0700) BP: (104-149)/(67-87) 136/86 (10/19 0700) SpO2:  [71 %-100 %] 73 % (10/19 0700) Weight:  [116.6 kg] 116.6 kg (10/19 0500)  Hemodynamic parameters for last 24 hours:    Intake/Output from previous day: 10/18 0701 - 10/19 0700 In: 360 [P.O.:360] Out: 1375 [Urine:1275; Chest Tube:100] Intake/Output this shift: No intake/output data recorded.  General appearance: alert, cooperative and no distress Neurologic: intact Heart: regular rate and rhythm Lungs: diminished breath sounds bibasilar Wound: clean and dry  Lab Results: Recent Labs    05/14/19 0547 05/15/19 0228  WBC 12.9* 10.8*  HGB 10.4* 10.0*  HCT 31.2* 30.0*  PLT 132* 169   BMET:  Recent Labs    05/14/19 0547 05/15/19 0228  NA 136 137  K 4.1 4.3  CL 101 103  CO2 25 24  GLUCOSE 118* 86  BUN 22 28*  CREATININE 1.41* 1.39*  CALCIUM 8.0* 8.0*    PT/INR:  Recent Labs    05/12/19 1533  LABPROT 18.0*  INR 1.5*   ABG    Component Value Date/Time   PHART 7.409 05/12/2019 2048   HCO3 21.1 05/12/2019 2048   TCO2 22 05/12/2019 2048   ACIDBASEDEF 3.0 (H) 05/12/2019 2048   O2SAT 97.0 05/12/2019 2048   CBG (last 3)  Recent Labs    05/14/19 2020 05/14/19 2209 05/15/19 0647  GLUCAP 130* 130* 102*    Assessment/Plan: S/P Procedure(s) (LRB): CORONARY ARTERY BYPASS GRAFTING (CABG) x 4 WITH ENDOSCOPIC HARVESTING OF RIGHT GREATER SAPHENOUS VEIN (N/A) RADIAL ARTERY HARVEST (Left) TRANSESOPHAGEAL  ECHOCARDIOGRAM (TEE) (N/A) Mitral Valve Repair (Mvr) (N/A) -POD # 3 Doing well transfer to 4E Resume Toprol and lisinopril Continue cardiac rehab Possibly home in AM   LOS: 3 days    Donald Hendrix 05/15/2019

## 2019-05-16 ENCOUNTER — Ambulatory Visit: Payer: BC Managed Care – PPO | Admitting: Cardiovascular Disease

## 2019-05-16 LAB — POCT I-STAT 7, (LYTES, BLD GAS, ICA,H+H)
Acid-Base Excess: 2 mmol/L (ref 0.0–2.0)
Acid-Base Excess: 2 mmol/L (ref 0.0–2.0)
Acid-Base Excess: 4 mmol/L — ABNORMAL HIGH (ref 0.0–2.0)
Acid-base deficit: 1 mmol/L (ref 0.0–2.0)
Bicarbonate: 27.5 mmol/L (ref 20.0–28.0)
Bicarbonate: 25.1 mmol/L (ref 20.0–28.0)
Bicarbonate: 26.8 mmol/L (ref 20.0–28.0)
Bicarbonate: 28.3 mmol/L — ABNORMAL HIGH (ref 20.0–28.0)
Bicarbonate: 24.2 mmol/L (ref 20.0–28.0)
Calcium, Ion: 1.23 mmol/L (ref 1.15–1.40)
Calcium, Ion: 1.19 mmol/L (ref 1.15–1.40)
Calcium, Ion: 0.96 mmol/L — ABNORMAL LOW (ref 1.15–1.40)
Calcium, Ion: 1.07 mmol/L — ABNORMAL LOW (ref 1.15–1.40)
Calcium, Ion: 1.07 mmol/L — ABNORMAL LOW (ref 1.15–1.40)
HCT: 35 % — ABNORMAL LOW (ref 39.0–52.0)
HCT: 35 % — ABNORMAL LOW (ref 39.0–52.0)
HCT: 28 % — ABNORMAL LOW (ref 39.0–52.0)
HCT: 29 % — ABNORMAL LOW (ref 39.0–52.0)
HCT: 28 % — ABNORMAL LOW (ref 39.0–52.0)
Hemoglobin: 11.9 g/dL — ABNORMAL LOW (ref 13.0–17.0)
Hemoglobin: 11.9 g/dL — ABNORMAL LOW (ref 13.0–17.0)
Hemoglobin: 9.5 g/dL — ABNORMAL LOW (ref 13.0–17.0)
Hemoglobin: 9.9 g/dL — ABNORMAL LOW (ref 13.0–17.0)
Hemoglobin: 9.5 g/dL — ABNORMAL LOW (ref 13.0–17.0)
O2 Saturation: 100 %
O2 Saturation: 100 %
O2 Saturation: 100 %
O2 Saturation: 100 %
O2 Saturation: 99 %
Potassium: 3.9 mmol/L (ref 3.5–5.1)
Potassium: 4 mmol/L (ref 3.5–5.1)
Potassium: 4 mmol/L (ref 3.5–5.1)
Potassium: 4.9 mmol/L (ref 3.5–5.1)
Potassium: 3.9 mmol/L (ref 3.5–5.1)
Sodium: 140 mmol/L (ref 135–145)
Sodium: 140 mmol/L (ref 135–145)
Sodium: 139 mmol/L (ref 135–145)
Sodium: 142 mmol/L (ref 135–145)
Sodium: 141 mmol/L (ref 135–145)
TCO2: 29 mmol/L (ref 22–32)
TCO2: 26 mmol/L (ref 22–32)
TCO2: 28 mmol/L (ref 22–32)
TCO2: 30 mmol/L (ref 22–32)
TCO2: 26 mmol/L (ref 22–32)
pCO2 arterial: 46.8 mmHg (ref 32.0–48.0)
pCO2 arterial: 42.5 mmHg (ref 32.0–48.0)
pCO2 arterial: 41.9 mmHg (ref 32.0–48.0)
pCO2 arterial: 44 mmHg (ref 32.0–48.0)
pCO2 arterial: 42.8 mmHg (ref 32.0–48.0)
pH, Arterial: 7.377 (ref 7.350–7.450)
pH, Arterial: 7.379 (ref 7.350–7.450)
pH, Arterial: 7.394 (ref 7.350–7.450)
pH, Arterial: 7.438 (ref 7.350–7.450)
pH, Arterial: 7.361 (ref 7.350–7.450)
pO2, Arterial: 379 mmHg — ABNORMAL HIGH (ref 83.0–108.0)
pO2, Arterial: 211 mmHg — ABNORMAL HIGH (ref 83.0–108.0)
pO2, Arterial: 297 mmHg — ABNORMAL HIGH (ref 83.0–108.0)
pO2, Arterial: 431 mmHg — ABNORMAL HIGH (ref 83.0–108.0)
pO2, Arterial: 120 mmHg — ABNORMAL HIGH (ref 83.0–108.0)

## 2019-05-16 LAB — CBC
HCT: 28.1 % — ABNORMAL LOW (ref 39.0–52.0)
Hemoglobin: 9.6 g/dL — ABNORMAL LOW (ref 13.0–17.0)
MCH: 30.7 pg (ref 26.0–34.0)
MCHC: 34.2 g/dL (ref 30.0–36.0)
MCV: 89.8 fL (ref 80.0–100.0)
Platelets: 192 10*3/uL (ref 150–400)
RBC: 3.13 MIL/uL — ABNORMAL LOW (ref 4.22–5.81)
RDW: 13.1 % (ref 11.5–15.5)
WBC: 8.4 10*3/uL (ref 4.0–10.5)
nRBC: 0 % (ref 0.0–0.2)

## 2019-05-16 LAB — BASIC METABOLIC PANEL
Anion gap: 9 (ref 5–15)
BUN: 20 mg/dL (ref 8–23)
CO2: 23 mmol/L (ref 22–32)
Calcium: 7.9 mg/dL — ABNORMAL LOW (ref 8.9–10.3)
Chloride: 102 mmol/L (ref 98–111)
Creatinine, Ser: 1.07 mg/dL (ref 0.61–1.24)
GFR calc Af Amer: >60 mL/min (ref >60)
GFR calc non Af Amer: >60 mL/min (ref >60)
Glucose, Bld: 101 mg/dL — ABNORMAL HIGH (ref 70–99)
Potassium: 4.4 mmol/L (ref 3.5–5.1)
Sodium: 134 mmol/L — ABNORMAL LOW (ref 135–145)

## 2019-05-16 LAB — POCT I-STAT, CHEM 8
BUN: 15 mg/dL (ref 8–23)
BUN: 13 mg/dL (ref 8–23)
BUN: 11 mg/dL (ref 8–23)
BUN: 14 mg/dL (ref 8–23)
BUN: 13 mg/dL (ref 8–23)
BUN: 15 mg/dL (ref 8–23)
BUN: 14 mg/dL (ref 8–23)
Calcium, Ion: 1.23 mmol/L (ref 1.15–1.40)
Calcium, Ion: 1.17 mmol/L (ref 1.15–1.40)
Calcium, Ion: 0.95 mmol/L — ABNORMAL LOW (ref 1.15–1.40)
Calcium, Ion: 1.08 mmol/L — ABNORMAL LOW (ref 1.15–1.40)
Calcium, Ion: 1.07 mmol/L — ABNORMAL LOW (ref 1.15–1.40)
Calcium, Ion: 1.05 mmol/L — ABNORMAL LOW (ref 1.15–1.40)
Calcium, Ion: 1.07 mmol/L — ABNORMAL LOW (ref 1.15–1.40)
Chloride: 102 mmol/L (ref 98–111)
Chloride: 105 mmol/L (ref 98–111)
Chloride: 100 mmol/L (ref 98–111)
Chloride: 102 mmol/L (ref 98–111)
Chloride: 104 mmol/L (ref 98–111)
Chloride: 104 mmol/L (ref 98–111)
Chloride: 103 mmol/L (ref 98–111)
Creatinine, Ser: 1.1 mg/dL (ref 0.61–1.24)
Creatinine, Ser: 1 mg/dL (ref 0.61–1.24)
Creatinine, Ser: 0.7 mg/dL (ref 0.61–1.24)
Creatinine, Ser: 1 mg/dL (ref 0.61–1.24)
Creatinine, Ser: 1 mg/dL (ref 0.61–1.24)
Creatinine, Ser: 1 mg/dL (ref 0.61–1.24)
Creatinine, Ser: 1 mg/dL (ref 0.61–1.24)
Glucose, Bld: 112 mg/dL — ABNORMAL HIGH (ref 70–99)
Glucose, Bld: 108 mg/dL — ABNORMAL HIGH (ref 70–99)
Glucose, Bld: 130 mg/dL — ABNORMAL HIGH (ref 70–99)
Glucose, Bld: 94 mg/dL (ref 70–99)
Glucose, Bld: 139 mg/dL — ABNORMAL HIGH (ref 70–99)
Glucose, Bld: 127 mg/dL — ABNORMAL HIGH (ref 70–99)
Glucose, Bld: 141 mg/dL — ABNORMAL HIGH (ref 70–99)
HCT: 35 % — ABNORMAL LOW (ref 39.0–52.0)
HCT: 33 % — ABNORMAL LOW (ref 39.0–52.0)
HCT: 29 % — ABNORMAL LOW (ref 39.0–52.0)
HCT: 30 % — ABNORMAL LOW (ref 39.0–52.0)
HCT: 29 % — ABNORMAL LOW (ref 39.0–52.0)
HCT: 27 % — ABNORMAL LOW (ref 39.0–52.0)
HCT: 26 % — ABNORMAL LOW (ref 39.0–52.0)
Hemoglobin: 11.9 g/dL — ABNORMAL LOW (ref 13.0–17.0)
Hemoglobin: 11.2 g/dL — ABNORMAL LOW (ref 13.0–17.0)
Hemoglobin: 10.2 g/dL — ABNORMAL LOW (ref 13.0–17.0)
Hemoglobin: 9.9 g/dL — ABNORMAL LOW (ref 13.0–17.0)
Hemoglobin: 9.9 g/dL — ABNORMAL LOW (ref 13.0–17.0)
Hemoglobin: 9.2 g/dL — ABNORMAL LOW (ref 13.0–17.0)
Hemoglobin: 8.8 g/dL — ABNORMAL LOW (ref 13.0–17.0)
Potassium: 3.9 mmol/L (ref 3.5–5.1)
Potassium: 4.1 mmol/L (ref 3.5–5.1)
Potassium: 4.1 mmol/L (ref 3.5–5.1)
Potassium: 4.3 mmol/L (ref 3.5–5.1)
Potassium: 4.6 mmol/L (ref 3.5–5.1)
Potassium: 5.1 mmol/L (ref 3.5–5.1)
Potassium: 3.9 mmol/L (ref 3.5–5.1)
Sodium: 140 mmol/L (ref 135–145)
Sodium: 139 mmol/L (ref 135–145)
Sodium: 139 mmol/L (ref 135–145)
Sodium: 142 mmol/L (ref 135–145)
Sodium: 138 mmol/L (ref 135–145)
Sodium: 139 mmol/L (ref 135–145)
Sodium: 141 mmol/L (ref 135–145)
TCO2: 27 mmol/L (ref 22–32)
TCO2: 24 mmol/L (ref 22–32)
TCO2: 26 mmol/L (ref 22–32)
TCO2: 27 mmol/L (ref 22–32)
TCO2: 27 mmol/L (ref 22–32)
TCO2: 25 mmol/L (ref 22–32)
TCO2: 23 mmol/L (ref 22–32)

## 2019-05-16 LAB — GLUCOSE, CAPILLARY: Glucose-Capillary: 104 mg/dL — ABNORMAL HIGH (ref 70–99)

## 2019-05-16 MED ORDER — ASPIRIN EC 81 MG PO TBEC
81.0000 mg | DELAYED_RELEASE_TABLET | Freq: Every day | ORAL | Status: DC
  Administered 2019-05-16 – 2019-05-17 (×2): 81 mg via ORAL
  Filled 2019-05-16 (×2): qty 1

## 2019-05-16 MED ORDER — APIXABAN 5 MG PO TABS
5.0000 mg | ORAL_TABLET | Freq: Two times a day (BID) | ORAL | Status: DC
  Administered 2019-05-16 – 2019-05-17 (×2): 5 mg via ORAL
  Filled 2019-05-16 (×2): qty 1

## 2019-05-16 NOTE — Progress Notes (Signed)
CT surgery p.m. Rounds  Patient examined and record reviewed.Hemodynamics stable,labs satisfactory.Patient had stable day.Continue current care. Donald Hendrix 05/16/2019

## 2019-05-16 NOTE — Discharge Instructions (Addendum)
Information on my medicine - ELIQUIS (apixaban)  This medication education was reviewed with me or my healthcare representative as part of my discharge preparation.  The pharmacist that spoke with me during my hospital stay was:  Maryan Char Lippucci, RPH  Why was Eliquis prescribed for you? Eliquis was prescribed for you to reduce the risk of a blood clot forming that can cause a stroke if you have a medical condition called atrial fibrillation (a type of irregular heartbeat).  What do You need to know about Eliquis ? Take your Eliquis TWICE DAILY - one tablet in the morning and one tablet in the evening with or without food. If you have difficulty swallowing the tablet whole please discuss with your pharmacist how to take the medication safely.  Take Eliquis exactly as prescribed by your doctor and DO NOT stop taking Eliquis without talking to the doctor who prescribed the medication.  Stopping may increase your risk of developing a stroke.  Refill your prescription before you run out.  After discharge, you should have regular check-up appointments with your healthcare provider that is prescribing your Eliquis.  In the future your dose may need to be changed if your kidney function or weight changes by a significant amount or as you get older.  What do you do if you miss a dose? If you miss a dose, take it as soon as you remember on the same day and resume taking twice daily.  Do not take more than one dose of ELIQUIS at the same time to make up a missed dose.  Important Safety Information A possible side effect of Eliquis is bleeding. You should call your healthcare provider right away if you experience any of the following: ? Bleeding from an injury or your nose that does not stop. ? Unusual colored urine (red or dark [place]) or unusual colored stools (red or black). ? Unusual bruising for unknown reasons. ? A serious fall or if you hit your head (even if there is no bleeding).  Some  medicines may interact with Eliquis and might increase your risk of bleeding or clotting while on Eliquis. To help avoid this, consult your healthcare provider or pharmacist prior to using any new prescription or non-prescription medications, including herbals, vitamins, non-steroidal anti-inflammatory drugs (NSAIDs) and supplements.  This website has more information on Eliquis (apixaban): http://www.eliquis.com/eliquis/home     Discharge Instructions:  1. You may shower, please wash incisions daily with soap and water and keep dry.  If you wish to cover wounds with dressing you may do so but please keep clean and change daily.  No tub baths or swimming until incisions have completely healed.  If your incisions become red or develop any drainage please call our office at [phone]  2. No Driving until cleared by our office and you are no longer using narcotic pain medications  3. Monitor your weight daily.. Please use the same scale and weigh at same time... If you gain 3-5 lbs in 48 hours with associated lower extremity swelling, please contact our office at [phone]  4. Fever of 101.5 for at least 24 hours with no source, please contact our office at [phone]  5. Activity- up as tolerated, please walk at least 3 times per day.  Avoid strenuous activity, no lifting, pushing, or pulling with your arms over 8-10 lbs for a minimum of 6 weeks  6. If any questions or concerns arise, please do not hesitate to contact our office at [phone]

## 2019-05-16 NOTE — Care Management (Signed)
05-16-19 Benefits Check submitted for Eliquis. CM will make patient aware of cost once completed. Bethena Roys, RN,BSN Case Manager [phone]

## 2019-05-16 NOTE — Progress Notes (Signed)
4 Days Post-Op Procedure(s) (LRB): CORONARY ARTERY BYPASS GRAFTING (CABG) x 4 WITH ENDOSCOPIC HARVESTING OF RIGHT GREATER SAPHENOUS VEIN (N/A) RADIAL ARTERY HARVEST (Left) TRANSESOPHAGEAL ECHOCARDIOGRAM (TEE) (N/A) Mitral Valve Repair (Mvr) (N/A) Subjective: No complaints. Wants to go home  Objective: Vital signs in last 24 hours: Temp:  [97.6 F (36.4 C)-98.9 F (37.2 C)] 98.9 F (37.2 C) (10/20 0742) Pulse Rate:  [52-93] 52 (10/19 1700) Cardiac Rhythm: Atrial flutter;Atrial fibrillation (10/19 2247) Resp:  [14-21] 18 (10/20 0700) BP: (90-120)/(59-80) 107/66 (10/19 2247) SpO2:  [97 %-99 %] 97 % (10/19 2247) FiO2 (%):  [21 %] 21 % (10/19 0853) Weight:  [591 kg] 117 kg (10/20 0500)  Hemodynamic parameters for last 24 hours:    Intake/Output from previous day: 10/19 0701 - 10/20 0700 In: 840 [P.O.:840] Out: 950 [Urine:950] Intake/Output this shift: No intake/output data recorded.  General appearance: alert, cooperative and no distress Neurologic: intact Heart: regular rate and rhythm Lungs: diminished breath sounds bilaterally Abdomen: normal findings: soft, non-tender Wound: clean and dry  Lab Results: Recent Labs    05/15/19 0228 05/16/19 0231  WBC 10.8* 8.4  HGB 10.0* 9.6*  HCT 30.0* 28.1*  PLT 169 192   BMET:  Recent Labs    05/15/19 0228 05/16/19 0231  NA 137 134*  K 4.3 4.4  CL 103 102  CO2 24 23  GLUCOSE 86 101*  BUN 28* 20  CREATININE 1.39* 1.07  CALCIUM 8.0* 7.9*    PT/INR: No results for input(s): LABPROT, INR in the last 72 hours. ABG    Component Value Date/Time   PHART 7.409 05/12/2019 2048   HCO3 21.1 05/12/2019 2048   TCO2 22 05/12/2019 2048   ACIDBASEDEF 3.0 (H) 05/12/2019 2048   O2SAT 97.0 05/12/2019 2048   CBG (last 3)  Recent Labs    05/15/19 1559 05/15/19 2210 05/16/19 0651  GLUCAP 97 93 104*    Assessment/Plan: S/P Procedure(s) (LRB): CORONARY ARTERY BYPASS GRAFTING (CABG) x 4 WITH ENDOSCOPIC HARVESTING OF RIGHT  GREATER SAPHENOUS VEIN (N/A) RADIAL ARTERY HARVEST (Left) TRANSESOPHAGEAL ECHOCARDIOGRAM (TEE) (N/A) Mitral Valve Repair (Mvr) (N/A) -   CV- in atrial flutter with controlled rate this AM. Rapid atrial pacing converted to A Fib. Still with controlled rate. Continue Toprol and amiodarone. Decrease ASA to 81 mg. Start Eliquis this evening after wires out  RESP- continue IS RENAL- creatinine back to normal GI- tolerating POs, + BM Will dc enoxaparin since starting Eliquis this evening DC CBG- have been normal   LOS: 4 days    [name] 05/16/2019

## 2019-05-16 NOTE — Progress Notes (Signed)
Transitions of Care Pharmacist Note  Donald Hendrix is a 68 y.o. male that has been diagnosed with A Fib and will be prescribed Eliquis (apixaban) at discharge.   Patient Education: I provided the following education on Eliquis to the patient: How to take the medication Described what the medication is Signs of bleeding Signs/symptoms of VTE and stroke  Answered their questions  Discharge Medications Plan: The patient wants to have their discharge medications filled by the Transitions of Care pharmacy rather than their usual pharmacy.  The discharge orders pharmacy has been changed to the Transitions of Care pharmacy, the patient will receive a phone call regarding co-pay, and their medications will be delivered by the Transitions of Care pharmacy.   Insurance information: N/A   Thank you,   Eddie Candle, PharmD PGY-1 Pharmacy Resident   May 16, 2019

## 2019-05-16 NOTE — Progress Notes (Signed)
CARDIAC REHAB PHASE I   Offered to walk with pt, pt states he just got cleaned up, and has been walking independently with walker. D/c ed completed with pt. Pt educated on importance of showers and monitoring incisions daily. Encouraged continued IS use, walks, and sternal precautions. Pt given in-the-tube sheet along with heart healthy diet. Reviewed restrictions and site care. Pt denies needs for home, has a walker from previous sx; pt also states he has a good support system and does not anticipate any problems. Will refer to [place].   [phone] Rufina Falco, RN BSN 05/16/2019 2:37 PM

## 2019-05-16 NOTE — Progress Notes (Signed)
D/C'd AV Epicardial Pacing wires per protocol. Patient instructed to remain in bed until 1035. Verbalized understanding.

## 2019-05-16 NOTE — TOC Benefit Eligibility Note (Signed)
Transition of Care [facility]) Benefit Eligibility Note    Patient Details  Name: Donald Hendrix MRN: [id number] Date of Birth: 13-Apr-1951   Medication/Dose: Arne Cleveland tablet 5 mg  :  Dose 5 mg  :  Oral  :  2 times daily  Covered?: Yes  Tier: 3 Drug  Prescription Coverage [facility]erred Pharmacy: [facility], [facility] or [facility]  Spoke with Person/Company/Phone Number:: Deashiah/ [employer]/ [phone]  Co-Pay: 45.00 for a 30 day supply Retail  90.00 for a 90 day supply Mail Order  Prior Approval: No  Deductible: (No Deductible)       Orbie Pyo Phone Number: 05/16/2019, 12:58 PM

## 2019-05-17 LAB — BASIC METABOLIC PANEL
Anion gap: 9 (ref 5–15)
BUN: 18 mg/dL (ref 8–23)
CO2: 24 mmol/L (ref 22–32)
Calcium: 8.1 mg/dL — ABNORMAL LOW (ref 8.9–10.3)
Chloride: 102 mmol/L (ref 98–111)
Creatinine, Ser: 1.02 mg/dL (ref 0.61–1.24)
GFR calc Af Amer: >60 mL/min (ref >60)
GFR calc non Af Amer: >60 mL/min (ref >60)
Glucose, Bld: 104 mg/dL — ABNORMAL HIGH (ref 70–99)
Potassium: 4.2 mmol/L (ref 3.5–5.1)
Sodium: 135 mmol/L (ref 135–145)

## 2019-05-17 LAB — CBC
HCT: 28 % — ABNORMAL LOW (ref 39.0–52.0)
Hemoglobin: 9.6 g/dL — ABNORMAL LOW (ref 13.0–17.0)
MCH: 30.9 pg (ref 26.0–34.0)
MCHC: 34.3 g/dL (ref 30.0–36.0)
MCV: 90 fL (ref 80.0–100.0)
Platelets: 242 10*3/uL (ref 150–400)
RBC: 3.11 MIL/uL — ABNORMAL LOW (ref 4.22–5.81)
RDW: 13.1 % (ref 11.5–15.5)
WBC: 8.3 10*3/uL (ref 4.0–10.5)
nRBC: 0 % (ref 0.0–0.2)

## 2019-05-17 MED ORDER — OXYCODONE HCL 5 MG PO TABS: 5.0000 mg | ORAL_TABLET | Freq: Four times a day (QID) | ORAL | 0 refills | Status: DC | PRN

## 2019-05-17 MED ORDER — AMIODARONE HCL 200 MG PO TABS: ORAL_TABLET | ORAL | 1 refills | Status: AC

## 2019-05-17 MED ORDER — ISOSORBIDE MONONITRATE ER 30 MG PO TB24: 15.0000 mg | ORAL_TABLET | Freq: Every day | ORAL | 0 refills | Status: DC

## 2019-05-17 MED ORDER — APIXABAN 5 MG PO TABS: 5.0000 mg | ORAL_TABLET | Freq: Two times a day (BID) | ORAL | 1 refills | Status: AC

## 2019-05-17 MED ORDER — ACETAMINOPHEN 500 MG PO TABS: 1000.0000 mg | ORAL_TABLET | Freq: Four times a day (QID) | ORAL | 0 refills | Status: AC

## 2019-05-17 MED FILL — ELIQUIS 5 MG TABLET: 5 | 30 days supply | Qty: 60 | Fill #0

## 2019-05-17 MED FILL — oxyCODONE HCL 5 MG TABS: 5 | 7 days supply | Qty: 30 | Fill #0

## 2019-05-17 MED FILL — ISOSORBIDE MN ER 30 MG TAB: 30 | 30 days supply | Qty: 15 | Fill #0

## 2019-05-17 MED FILL — ACETAMINOPHEN 500MG XT STRE: 500 | 4 days supply | Qty: 30 | Fill #0

## 2019-05-17 MED FILL — AMIODARONE HCL 200 MG TAB: 200 | 30 days supply | Qty: 70 | Fill #0

## 2019-05-17 MED FILL — Mannitol IV Soln 20%: INTRAVENOUS | Qty: 500 | Status: AC

## 2019-05-17 MED FILL — Lidocaine HCl Local Soln Prefilled Syringe 100 MG/5ML (2%): INTRAMUSCULAR | Qty: 5 | Status: AC

## 2019-05-17 MED FILL — Electrolyte-R (PH 7.4) Solution: INTRAVENOUS | Qty: 6000 | Status: AC

## 2019-05-17 MED FILL — Sodium Chloride IV Soln 0.9%: INTRAVENOUS | Qty: 3000 | Status: AC

## 2019-05-17 MED FILL — Heparin Sodium (Porcine) Inj 1000 Unit/ML: INTRAMUSCULAR | Qty: 10 | Status: AC

## 2019-05-17 NOTE — Plan of Care (Signed)
Problem: Education: Goal: Knowledge of General Education information will improve Description: Including pain rating scale, medication(s)/side effects and non-pharmacologic comfort measures 05/17/2019 0855 by Dario Guardian, RN Outcome: Completed/Met 05/17/2019 0743 by Dario Guardian, RN Outcome: Progressing   Problem: Health Behavior/Discharge Planning: Goal: Ability to manage health-related needs will improve 05/17/2019 0855 by Dario Guardian, RN Outcome: Completed/Met 05/17/2019 0743 by Dario Guardian, RN Outcome: Progressing   Problem: Clinical Measurements: Goal: Ability to maintain clinical measurements within normal limits will improve 05/17/2019 0855 by Dario Guardian, RN Outcome: Completed/Met 05/17/2019 0743 by Dario Guardian, RN Outcome: Progressing Goal: Will remain free from infection 05/17/2019 0855 by Dario Guardian, RN Outcome: Completed/Met 05/17/2019 0743 by Dario Guardian, RN Outcome: Progressing Goal: Diagnostic test results will improve 05/17/2019 0855 by Dario Guardian, RN Outcome: Completed/Met 05/17/2019 0743 by Dario Guardian, RN Outcome: Progressing Goal: Respiratory complications will improve 05/17/2019 0855 by Dario Guardian, RN Outcome: Completed/Met 05/17/2019 0743 by Dario Guardian, RN Outcome: Progressing Goal: Cardiovascular complication will be avoided 05/17/2019 0855 by Dario Guardian, RN Outcome: Completed/Met 05/17/2019 0743 by Dario Guardian, RN Outcome: Progressing   Problem: Activity: Goal: Risk for activity intolerance will decrease 05/17/2019 0855 by Dario Guardian, RN Outcome: Completed/Met 05/17/2019 0743 by Dario Guardian, RN Outcome: Progressing   Problem: Nutrition: Goal: Adequate nutrition will be maintained 05/17/2019 0855 by Dario Guardian, RN Outcome: Completed/Met 05/17/2019 0743 by Dario Guardian, RN Outcome: Progressing   Problem: Coping: Goal: Level of anxiety will decrease 05/17/2019 0855 by Dario Guardian, RN Outcome: Completed/Met 05/17/2019 0743 by  Dario Guardian, RN Outcome: Progressing   Problem: Elimination: Goal: Will not experience complications related to bowel motility 05/17/2019 0855 by Dario Guardian, RN Outcome: Completed/Met 05/17/2019 0743 by Dario Guardian, RN Outcome: Progressing Goal: Will not experience complications related to urinary retention 05/17/2019 0855 by Dario Guardian, RN Outcome: Completed/Met 05/17/2019 0743 by Dario Guardian, RN Outcome: Progressing   Problem: Pain Managment: Goal: General experience of comfort will improve 05/17/2019 0855 by Dario Guardian, RN Outcome: Completed/Met 05/17/2019 0743 by Dario Guardian, RN Outcome: Progressing   Problem: Safety: Goal: Ability to remain free from injury will improve 05/17/2019 0855 by Dario Guardian, RN Outcome: Completed/Met 05/17/2019 0743 by Dario Guardian, RN Outcome: Progressing   Problem: Skin Integrity: Goal: Risk for impaired skin integrity will decrease 05/17/2019 0855 by Dario Guardian, RN Outcome: Completed/Met 05/17/2019 0743 by Dario Guardian, RN Outcome: Progressing   Problem: Education: Goal: Will demonstrate proper wound care and an understanding of methods to prevent future damage 05/17/2019 0855 by Dario Guardian, RN Outcome: Completed/Met 05/17/2019 0743 by Dario Guardian, RN Outcome: Progressing Goal: Knowledge of disease or condition will improve 05/17/2019 0855 by Dario Guardian, RN Outcome: Completed/Met 05/17/2019 0743 by Dario Guardian, RN Outcome: Progressing Goal: Knowledge of the prescribed therapeutic regimen will improve 05/17/2019 0855 by Dario Guardian, RN Outcome: Completed/Met 05/17/2019 0743 by Dario Guardian, RN Outcome: Progressing Goal: Individualized Educational Video(s) 05/17/2019 0855 by Dario Guardian, RN Outcome: Completed/Met 05/17/2019 0743 by Dario Guardian, RN Outcome: Progressing   Problem: Activity: Goal: Risk for activity intolerance will decrease 05/17/2019 0855 by Dario Guardian, RN Outcome: Completed/Met 05/17/2019 0743 by Dario Guardian, RN Outcome: Progressing   Problem: Cardiac: Goal: Will achieve and/or maintain hemodynamic stability 05/17/2019 0855 by Dario Guardian, RN Outcome: Completed/Met 05/17/2019 0743 by Dario Guardian, RN Outcome: Progressing   Problem: Clinical Measurements: Goal: Postoperative complications will be avoided or minimized 05/17/2019 0855 by Dario Guardian, RN Outcome: Completed/Met 05/17/2019 0743 by Dario Guardian, RN Outcome: Progressing   Problem: Respiratory: Goal: Respiratory  status will improve 05/17/2019 0855 by Dario Guardian, RN Outcome: Completed/Met 05/17/2019 0743 by Dario Guardian, RN Outcome: Progressing   Problem: Skin Integrity: Goal: Wound healing without signs and symptoms of infection 05/17/2019 0855 by Dario Guardian, RN Outcome: Completed/Met 05/17/2019 0743 by Dario Guardian, RN Outcome: Progressing Goal: Risk for impaired skin integrity will decrease 05/17/2019 0855 by Dario Guardian, RN Outcome: Completed/Met 05/17/2019 0743 by Dario Guardian, RN Outcome: Progressing   Problem: Urinary Elimination: Goal: Ability to achieve and maintain adequate renal perfusion and functioning will improve 05/17/2019 0855 by Dario Guardian, RN Outcome: Completed/Met 05/17/2019 0743 by Dario Guardian, RN Outcome: Progressing

## 2019-05-17 NOTE — Plan of Care (Signed)

## 2019-05-17 NOTE — Progress Notes (Signed)
CARDIAC REHAB PHASE I   Pt denies questions or concerns regarding yesterday's education. Excited and ready to go home. Encouraged pt not to over-do it at home. Pt referred to [facility] [place].  [phone] Rufina Falco, RN BSN 05/17/2019 9:09 AM

## 2019-05-17 NOTE — Progress Notes (Signed)
5 Days Post-Op Procedure(s) (LRB): CORONARY ARTERY BYPASS GRAFTING (CABG) x 4 WITH ENDOSCOPIC HARVESTING OF RIGHT GREATER SAPHENOUS VEIN (N/A) RADIAL ARTERY HARVEST (Left) TRANSESOPHAGEAL ECHOCARDIOGRAM (TEE) (N/A) Mitral Valve Repair (Mvr) (N/A) Subjective: No complaints, wants to go home  Objective: Vital signs in last 24 hours: Temp:  [97.9 F (36.6 C)-98.7 F (37.1 C)] 97.9 F (36.6 C) (10/21 0747) Pulse Rate:  [61] 61 (10/21 0404) Cardiac Rhythm: Normal sinus rhythm (10/21 0404) Resp:  [15-27] 20 (10/21 0600) BP: (119-146)/(80-87) 120/83 (10/21 0404) SpO2:  [98 %] 98 % (10/20 1942) Weight:  [637 kg] 116 kg (10/21 0500)  Hemodynamic parameters for last 24 hours:    Intake/Output from previous day: 10/20 0701 - 10/21 0700 In: 600 [P.O.:600] Out: -  Intake/Output this shift: Total I/O In: 240 [P.O.:240] Out: -   General appearance: alert, cooperative and no distress Neurologic: intact Heart: regular rate and rhythm Lungs: clear to auscultation bilaterally Wound: clean and dry  Lab Results: Recent Labs    05/16/19 0231 05/17/19 0425  WBC 8.4 8.3  HGB 9.6* 9.6*  HCT 28.1* 28.0*  PLT 192 242   BMET:  Recent Labs    05/16/19 0231 05/17/19 0425  NA 134* 135  K 4.4 4.2  CL 102 102  CO2 23 24  GLUCOSE 101* 104*  BUN 20 18  CREATININE 1.07 1.02  CALCIUM 7.9* 8.1*    PT/INR: No results for input(s): LABPROT, INR in the last 72 hours. ABG    Component Value Date/Time   PHART 7.409 05/12/2019 2048   HCO3 21.1 05/12/2019 2048   TCO2 22 05/12/2019 2048   ACIDBASEDEF 3.0 (H) 05/12/2019 2048   O2SAT 97.0 05/12/2019 2048   CBG (last 3)  Recent Labs    05/15/19 1559 05/15/19 2210 05/16/19 0651  GLUCAP 97 93 104*    Assessment/Plan: S/P Procedure(s) (LRB): CORONARY ARTERY BYPASS GRAFTING (CABG) x 4 WITH ENDOSCOPIC HARVESTING OF RIGHT GREATER SAPHENOUS VEIN (N/A) RADIAL ARTERY HARVEST (Left) TRANSESOPHAGEAL ECHOCARDIOGRAM (TEE) (N/A) Mitral Valve  Repair (Mvr) (N/A) -Converted to SR  Will plan to dc on metoprolol, amiodarone and Eliquis On home dose lisinopril Home today   LOS: 5 days    [name] 05/17/2019

## 2019-05-17 NOTE — Discharge Summary (Addendum)
[address]       [place] [address]             [phone]      Physician Discharge Summary  Patient ID: Donald Hendrix MRN: [id number] DOB/AGE: 04-Jun-1951 68 y.o.  Admit date: 05/12/2019 Discharge date: 05/17/2019  Admission Diagnoses: Left main and 3 vessel coronary artery disease Patient Active Problem List   Diagnosis Date Noted  . Claudication in peripheral vascular disease (HCC) 05/01/2019  . CAD in native artery 05/01/2019  . Abnormal nuclear stress test 04/21/2019  . Hyperlipidemia 04/21/2019  . Essential hypertension 04/21/2019    Discharge Diagnoses:  Left main and 3 vessel coronary artery disease Moderately severe mitral regurgitation Active Problems:   S/P CABG x 4   Discharged Condition: good  HPI:  Donald Hendrix is a 68 year old gentleman with no prior history of coronary disease.  He does have a past medical history significant for hypertension and hyperlipidemia.  He also is overweight.  Over the past couple of years he has noted some chest tightness while mowing his lawn.  He usually does not experience it with walking only.  He recently noticed that is become more frequent, more severe, and last longer before resolving.  It has always resolved with rest.  He saw Dr. Hanley Hays and a stress Myoview was done.  It showed inferior and apical ischemia.  An echocardiogram earlier had shown aortic sclerosis but no significant stenosis.  He was referred to Dr. Gery Pray and underwent cardiac catheterization which revealed left main and three-vessel coronary disease.  He had a totally occluded right coronary and a diffusely diseased LAD.  He has not had any rest or nocturnal symptoms.  Hospital Course:   [name] is a 68 year old male patient who underwent a coronary bypass grafting x3 with endoscopic right saphenous vein harvest and left radial artery harvest and a mitral valve repair with Dr. Dorris Fetch on 05/12/2019.  He tolerated the  procedure well and was transferred to the surgical ICU.  He was extubated in a timely manner.  Postop day 1 he was a paced at 90 bpm and we were weaning his drips.  We ordered some Lasix since his weight was up about 14 pounds.  Overall he was doing well.  We were able to discontinue his chest tubes and lines.  We added Imdur for the radial artery harvest.  We began to mobilize the patient.  On postop day 2 he remained in normal sinus rhythm and was a paced at 80.  We held his Lopressor.  His weight continued to come down.  By that evening the patient was off the pacemaker and he was in normal sinus rhythm with a rate of 60 to 65 bpm.  He had a good blood pressure.  We discontinued his Foley catheter.  Postop day 3 he was doing well and transfer orders were placed.  We resumed his Toprol and lisinopril.  We continued cardiac rehab for debility.  Postop day 4 he did have some a flutter with a controlled rate.  Rapid atrial pacing converted into atrial fibrillation.  He still remains with a controlled rate.  We started Eliquis once his pacing wires were removed.  He continued on aspirin 81 mg.  His blood glucose have been normal therefore we discontinued his CBGs.  He did have a bowel movement and was tolerating foods.  His creatinine was back to normal.  Today, he is tolerating room air, his  incision is healing well, he is ambulating with limited assistance, and he is ready for discharge home.  Consults: None  Significant Diagnostic Studies:   CLINICAL DATA:  Follow-up examination status post CABG.  EXAM: CHEST - 2 VIEW  COMPARISON:  Prior radiograph from 05/14/2019.  FINDINGS: Right IJ Cordis sheath has been removed. Median sternotomy wires with sequelae of prior CABG again noted. Stable cardiomegaly. Mediastinal silhouette within normal limits.  Current exam has been performed with within improved degree of lung inflation. Bilateral chest tubes have been removed. Persistent but improved left  basilar atelectasis. Minimal right basilar atelectasis. No other focal infiltrates. No pulmonary edema or visible pleural effusion. No pneumothorax.  Osseous structures are unchanged.  IMPRESSION: 1. Interval removal of right IJ sheath and chest tube. No pneumothorax. 2. Persistent but improved left basilar atelectasis. 3. No other new active cardiopulmonary disease.   Electronically Signed   By: Rise MuBenjamin  McClintock M.D.   On: 05/15/2019 05:57  Treatments:   NAME: Reginal LutesBROWN, Yona M. MEDICAL RECORD [id number] ACCOUNT [id number] DATE OF BIRTH:1950-08-06 FACILITY: [facility] LOCATION: [facility] PHYSICIAN: Lars Pinks. , MD  OPERATIVE REPORT  DATE OF PROCEDURE:  05/12/2019  PREOPERATIVE DIAGNOSES:  Three-vessel coronary disease with exertional angina.  POSTOPERATIVE DIAGNOSES:  Three-vessel coronary disease with exertional angina, moderately severe mitral regurgitation secondary to P3 prolapse.  PROCEDURE:  Median sternotomy, extracorporeal circulation, coronary artery bypass grafting x4 (left internal mammary artery to left anterior descending, left radial artery to OM3, saphenous vein graft to OM1, saphenous vein graft to first diagonal),   endoscopic left radial artery harvest, endoscopic saphenous vein harvest right thigh, mitral valve repair.  SURGEON:  Charlett LangoSteven , MD  ASSISTANT:  Gershon CraneWayne Gold, PA-C  SECOND ASSISTANT:  Jari Favreessa Conte, PA-C   ANESTHESIA:  General.   Discharge Exam: Blood pressure 120/83, pulse 61, temperature 97.9 F (36.6 C), temperature source Oral, resp. rate 20, height 6\' 1"  (1.854 m), weight 116 kg, SpO2 98 %.    General appearance: alert, cooperative and no distress Neurologic: intact Heart: regular rate and rhythm Lungs: diminished breath sounds bilaterally Abdomen: normal findings: soft, non-tender Wound: clean and dry  Disposition: Discharge disposition: 01-Home or Self Care       Discharge Instructions    Amb  Referral to Cardiac Rehabilitation   Complete by: As directed    Diagnosis:  CABG Valve Repair     Valve: Mitral   CABG X ___: 4   After initial evaluation and assessments completed: Virtual Based Care may be provided alone or in conjunction with Phase 2 Cardiac Rehab based on patient barriers.: Yes     Allergies as of 05/17/2019      Reactions   Cephalexin Hives   Dexamethasone Sodium Phosphate    UNSPECIFIED REACTION    Bactrim [sulfamethoxazole-trimethoprim] Rash      Medication List    STOP taking these medications   colchicine 0.6 MG tablet   etodolac 400 MG tablet Commonly known as: LODINE   nitroGLYCERIN 0.4 MG SL tablet Commonly known as: NITROSTAT     TAKE these medications   acetaminophen 500 MG tablet Commonly known as: TYLENOL Take 2 tablets (1,000 mg total) by mouth every 6 (six) hours.   amiodarone 200 MG tablet Commonly known as: PACERONE Please take 400mg  (2 tabs) twice a day for 2 days then take 200mg  (1 tab) twice a day until we see you in follow-up.   apixaban 5 MG Tabs tablet Commonly known as: ELIQUIS Take 1 tablet (5 mg total) by  mouth 2 (two) times daily.   aspirin EC 81 MG tablet Take 81 mg by mouth daily.   atorvastatin 80 MG tablet Commonly known as: LIPITOR Take 1 tablet (80 mg total) by mouth daily at 6 PM.   esomeprazole 40 MG capsule Commonly known as: NEXIUM Take 40 mg by mouth daily.   isosorbide mononitrate 30 MG 24 hr tablet Commonly known as: IMDUR Take 0.5 tablets (15 mg total) by mouth daily. What changed: how much to take   lisinopril 5 MG tablet Commonly known as: ZESTRIL Take 5 mg by mouth daily.   metoprolol succinate 25 MG 24 hr tablet Commonly known as: TOPROL-XL Take 25 mg by mouth daily.   MULTI-VITAMIN DAILY PO Take 1 tablet by mouth daily.   oxyCODONE 5 MG immediate release tablet Commonly known as: Oxy IR/ROXICODONE Take 1 tablet (5 mg total) by mouth every 6 (six) hours as needed for severe pain.    PARoxetine 10 MG tablet Commonly known as: PAXIL Take 10 mg by mouth daily.     The patient has been discharged on:   1.Beta Blocker:  Yes [ YES  ]                              No   [   ]                              If No, reason:  2.Ace Inhibitor/ARB: Yes [  YES ]                                     No  [    ]                                     If No, reason:  3.Statin:   Yes [ YES  ]                  No  [   ]                  If No, reason:  4.Ecasa:  Yes  [ YES  ]                  No   [   ]                  If No, reason:   Follow-up Information    Franchot Mimes, MD. Call in 1 day(s).   Specialty: Family Medicine Contact information: [address] [facility] [place] [address] [phone]        [name], MD Follow up.   Specialty: Cardiothoracic Surgery Why: Your routine follow-up appointment is on Nov 17th at 11:30am.  Please arrive at 28 AM for a chest x-ray located at [facility] imaging which is on the first floor of our building. Contact information: [address] [place] [address] [phone]           Signed: Elgie Collard 05/17/2019, 8:51 AM

## 2019-05-18 LAB — TYPE AND SCREEN
ABO/RH(D): A POS
Antibody Screen: NEGATIVE
Unit division: 0
Unit division: 0
Unit division: 0
Unit division: 0

## 2019-05-18 LAB — BPAM RBC
Blood Product Expiration Date: 202011072359
Blood Product Expiration Date: 202011072359
Blood Product Expiration Date: 202011082359
Blood Product Expiration Date: 202011082359
ISSUE DATE / TIME: 202010161103
ISSUE DATE / TIME: 202010161103
Unit Type and Rh: 6200
Unit Type and Rh: 6200
Unit Type and Rh: 6200
Unit Type and Rh: 6200

## 2019-05-18 NOTE — Anesthesia Postprocedure Evaluation (Signed)
Anesthesia Post Note  Patient: Donald Hendrix  Procedure(s) Performed: CORONARY ARTERY BYPASS GRAFTING (CABG) x 4 WITH ENDOSCOPIC HARVESTING OF RIGHT GREATER SAPHENOUS VEIN (N/A Chest) RADIAL ARTERY HARVEST (Left ) TRANSESOPHAGEAL ECHOCARDIOGRAM (TEE) (N/A ) Mitral Valve Repair (Mvr) (N/A )     Patient location during evaluation: ICU Anesthesia Type: General Level of consciousness: sedated Pain management: pain level controlled Vital Signs Assessment: post-procedure vital signs reviewed and stable Respiratory status: patient remains intubated per anesthesia plan Cardiovascular status: stable Postop Assessment: no apparent nausea or vomiting Anesthetic complications: no    Last Vitals:  Vitals:   05/17/19 0747 05/17/19 0918  BP:  131/83  Pulse:  65  Resp:  (!) 22  Temp: 36.6 C   SpO2:  98%    Last Pain:  Vitals:   05/17/19 0754  TempSrc:   PainSc: 0-No pain                 Sharyah Bostwick

## 2019-05-19 LAB — ECHO INTRAOPERATIVE TEE
AV Mean grad: 3 mmHg — NL
Ao-asc: 4.1 cm — NL
Height: 73 in — NL
LVOT diameter: 23 mm — NL
Mean grad: 0 mmHg — NL
STJ: 2.7 cm — NL
Sinus: 3.3 cm — NL
Weight: 4099.22 oz — NL

## 2019-05-19 NOTE — Anesthesia Procedure Notes (Signed)
Central Venous Catheter Insertion Performed by: Oleta Mouse, MD, anesthesiologist Start/End10/16/2020 8:07 AM, 05/12/2019 8:11 AM Patient location: Pre-op. Preanesthetic checklist: patient identified, IV checked, site marked, risks and benefits discussed, surgical consent, monitors and equipment checked, pre-op evaluation, timeout performed and anesthesia consent Hand hygiene performed  and maximum sterile barriers used  PA cath was placed.Swan type:thermodilution Procedure performed without using ultrasound guided technique. Attempts: 1 Patient tolerated the procedure well with no immediate complications.

## 2019-05-24 ENCOUNTER — Other Ambulatory Visit: Payer: Self-pay | Admitting: Cardiology

## 2019-05-30 ENCOUNTER — Telehealth: Payer: Self-pay

## 2019-05-30 NOTE — Telephone Encounter (Signed)
Pt calls office w/ c/o nasal allergies and congestion, reporting that sneezing and coughing is causing significant pain to chest/incision sites. He wants recommendation regarding medications to help with this that do not interact w/ his prescribed meds. Pt had CABG on 05/12/19. States he is no longer taking Oxycodone or OTC pain meds. Pt reports he has seasonal allergies and denies other s/s associated w/ COVID-19. Advised to take OTC antihistamine prn for sneezing and/or OTC guaifenesin prn as directed to help loosen congestion. Also suggested that he take acetaminophen prn as directed to lessen pain associated w/ any sneezing or coughing. Advised to call office prn further problems or questions.

## 2019-05-31 ENCOUNTER — Ambulatory Visit: Payer: BC Managed Care – PPO | Admitting: Cardiology

## 2019-06-06 ENCOUNTER — Telehealth: Payer: BC Managed Care – PPO | Admitting: Cardiology

## 2019-06-07 ENCOUNTER — Other Ambulatory Visit: Payer: Self-pay | Admitting: Cardiovascular Disease

## 2019-06-09 ENCOUNTER — Other Ambulatory Visit: Payer: Self-pay | Admitting: Thoracic Surgery (Cardiothoracic Vascular Surgery)

## 2019-06-09 DIAGNOSIS — Z951 Presence of aortocoronary bypass graft: Secondary | ICD-10-CM

## 2019-06-13 ENCOUNTER — Ambulatory Visit
Admission: RE | Admit: 2019-06-13 | Discharge: 2019-06-13 | Disposition: A | Discharge status: Home / Self Care | Payer: BC Managed Care – PPO | Source: Ambulatory Visit | Attending: Thoracic Surgery (Cardiothoracic Vascular Surgery) | Admitting: Thoracic Surgery (Cardiothoracic Vascular Surgery)

## 2019-06-13 ENCOUNTER — Other Ambulatory Visit: Payer: Self-pay

## 2019-06-13 ENCOUNTER — Ambulatory Visit (INDEPENDENT_AMBULATORY_CARE_PROVIDER_SITE_OTHER): Payer: Self-pay | Admitting: Thoracic Surgery (Cardiothoracic Vascular Surgery)

## 2019-06-13 ENCOUNTER — Encounter: Payer: Self-pay | Admitting: Thoracic Surgery (Cardiothoracic Vascular Surgery)

## 2019-06-13 VITALS — BP 117/69 | HR 80 | Temp 96.8°F | Resp 16 | Ht 72.0 in | Wt 221.6 lb

## 2019-06-13 DIAGNOSIS — Z951 Presence of aortocoronary bypass graft: Secondary | ICD-10-CM

## 2019-06-13 DIAGNOSIS — I4891 Unspecified atrial fibrillation: Secondary | ICD-10-CM

## 2019-06-13 DIAGNOSIS — I9789 Other postprocedural complications and disorders of the circulatory system, not elsewhere classified: Secondary | ICD-10-CM

## 2019-06-13 DIAGNOSIS — Z9889 Other specified postprocedural states: Secondary | ICD-10-CM

## 2019-06-13 NOTE — Progress Notes (Signed)
[address]       [place] [address]             [phone]       HPI: [name] returns for a scheduled follow-up visit  Donald Hendrix is a 68 year old man with multiple cardiac risk factors who presented with exertional chest tightness.  He had a positive stress Myoview.  Cardiac catheterization revealed left main and three-vessel coronary disease.  His intraoperative TEE showed moderate mitral regurgitation with a flail P3 segment of the posterior leaflet due to a ruptured cord.  I did coronary artery bypass grafting x4 using left radial and mammary in addition to saphenous vein.  I also did a mitral valve repair.  He had some atrial fibrillation postoperatively.  He converted to sinus rhythm on amiodarone.  He was discharged on amiodarone and Eliquis.  He is beginning to feel better.  He has some pain along the right rib cage, but is not having to take any narcotics for that.  He has not had any anginal pain.  His swelling in his legs is better than it was preoperatively.  He has begun driving without any issues.  He is anxious to return to work.  Past Medical History:  Diagnosis Date  . Abnormal weight gain   . Arthritis   . Coronary artery disease   . GERD (gastroesophageal reflux disease)   . Head injury, closed, with concussion   . Heart murmur   . High risk medication use   . Hypercholesterolemia   . Hyperlipidemia, mixed   . Malignant hypertension   . MRSA (methicillin resistant Staphylococcus aureus) colonization 09/09/2018   positive PCR  . Vitamin D deficiency     Current Outpatient Medications  Medication Sig Dispense Refill  . acetaminophen (TYLENOL) 500 MG tablet Take 2 tablets (1,000 mg total) by mouth every 6 (six) hours. 30 tablet 0  . amiodarone (PACERONE) 200 MG tablet Please take 400mg  (2 tabs) twice a day for 2 days then take 200mg  (1 tab) twice a day until we see you in follow-up. 70 tablet 1  . apixaban (ELIQUIS) 5 MG TABS tablet  Take 1 tablet (5 mg total) by mouth 2 (two) times daily. 60 tablet 1  . aspirin EC 81 MG tablet Take 81 mg by mouth daily.    Marland Kitchen atorvastatin (LIPITOR) 80 MG tablet Take 1 tablet (80 mg total) by mouth daily at 6 PM. 90 tablet 1  . esomeprazole (NEXIUM) 40 MG capsule Take 40 mg by mouth daily.     . isosorbide mononitrate (IMDUR) 30 MG 24 hr tablet Take 0.5 tablets (15 mg total) by mouth daily. 15 tablet 0  . lisinopril (ZESTRIL) 5 MG tablet Take 5 mg by mouth daily.    . metoprolol succinate (TOPROL-XL) 25 MG 24 hr tablet Take 25 mg by mouth daily.    . Multiple Vitamin (MULTI-VITAMIN DAILY PO) Take 1 tablet by mouth daily.    Marland Kitchen PARoxetine (PAXIL) 10 MG tablet Take 10 mg by mouth daily.     No current facility-administered medications for this visit.     Physical Exam BP 117/69 (BP Location: Left Arm, Patient Position: Sitting, Cuff Size: Normal)   Pulse 80   Temp (!) 96.8 F (36 C)   Resp 16   Ht 6' (1.829 m)   Wt 221 lb 9.6 oz (100.5 kg)   SpO2 96% Comment: RA  BMI 30.21 kg/m  68 year old man in no  acute distress Alert and oriented x3 with no focal deficits Lungs clear with equal breath sounds bilaterally Cardiac regular rate and rhythm normal S1 and S2 with no rub or murmur Sternum stable, incision clean dry and intact Left wrist incision intact with minimal eschar Leg incision intact, no peripheral edema  Diagnostic Tests: CHEST - 2 VIEW  COMPARISON:  05/15/2019  FINDINGS: Cardiomegaly status post median sternotomy and CABG. Tortuosity of the thoracic aorta. Both lungs are clear. Disc degenerative disease of the thoracic spine.  IMPRESSION: Cardiomegaly without acute abnormality of the lungs.   Electronically Signed   By: Lauralyn Primes M.D.   On: 06/13/2019 11:03 I personally reviewed the chest x-ray and concur with the findings noted above  Impression: Donald Hendrix is a 68 year old man with a past medical history significant for hypertension and  hyperlipidemia.  He presented with exertional chest tightness.  He had a positive stress test.  He was found to have left main and three-vessel coronary disease.  Intraoperative TEE showed moderate mitral regurgitation due to P3 prolapse.  I did coronary bypass grafting x4 and mitral valve repair on 05/12/2019.  He is now a month out from surgery and is doing well.  He may drive.  Appropriate precautions were discussed.  He should not lift anything over 10 pounds for another 2 weeks.  He is anxious to return to work.  He is a [employer].  I think he can return on November 30, the Monday after Thanksgiving.  He had postoperative atrial fibrillation and went home on amiodarone and Eliquis.  Typically we would leave those on for about 8 weeks.  If he is in sinus rhythm at that point they can be discontinued.  He can discuss that with Dr. Hanley Hays on follow-up.  He went home on Imdur for a month for his radial artery graft.  That does not need to be refilled.  Plan: Follow-up with Dr. Hanley Hays.  I will be happy to see Mr. Fritze back anytime in the future if I can be of any further assistance with his care  Loreli Slot, MD [facility] [phone]

## 2019-07-05 ENCOUNTER — Other Ambulatory Visit: Payer: Self-pay | Admitting: Thoracic Surgery (Cardiothoracic Vascular Surgery)

## 2019-07-05 DIAGNOSIS — Z951 Presence of aortocoronary bypass graft: Secondary | ICD-10-CM

## 2019-07-06 ENCOUNTER — Encounter: Payer: Self-pay | Admitting: Physician Assistant

## 2019-07-06 ENCOUNTER — Other Ambulatory Visit: Payer: Self-pay

## 2019-07-06 ENCOUNTER — Ambulatory Visit (INDEPENDENT_AMBULATORY_CARE_PROVIDER_SITE_OTHER): Payer: Self-pay | Admitting: Physician Assistant

## 2019-07-06 VITALS — BP 130/75 | HR 72 | Temp 97.8°F | Resp 20 | Ht 72.0 in | Wt 221.0 lb

## 2019-07-06 DIAGNOSIS — L03313 Cellulitis of chest wall: Secondary | ICD-10-CM

## 2019-07-06 DIAGNOSIS — Z9889 Other specified postprocedural states: Secondary | ICD-10-CM

## 2019-07-06 DIAGNOSIS — Z951 Presence of aortocoronary bypass graft: Secondary | ICD-10-CM

## 2019-07-06 MED ORDER — DOXYCYCLINE HYCLATE 100 MG PO TABS: 100.0000 mg | ORAL_TABLET | Freq: Two times a day (BID) | ORAL | 0 refills | Status: DC

## 2019-07-06 NOTE — Patient Instructions (Signed)
Please wash incision daily with soap and water  Ensure wound is dry, cover with thin layer of antibiotic ointment, cover with clean and dry dressing daily   If you develop fever, chills, please contact office immediately.

## 2019-07-06 NOTE — Progress Notes (Signed)
HPI: Patient returns for new onset sternal drainage.  He is S/P CABG that was performed back in October.  The patient states that his incision has always been a little bit red since he left the hospital.  However recently he noticed some minor drainage present.  He states it wasn't much.  He denies fevers, chills, sweats.  He was evaluated by his regular doctor on Monday who gave him some antibiotic ointment.  Current Outpatient Medications  Medication Sig Dispense Refill  . acetaminophen (TYLENOL) 500 MG tablet Take 2 tablets (1,000 mg total) by mouth every 6 (six) hours. 30 tablet 0  . amiodarone (PACERONE) 200 MG tablet Please take 400mg  (2 tabs) twice a day for 2 days then take 200mg  (1 tab) twice a day until we see you in follow-up. 70 tablet 1  . apixaban (ELIQUIS) 5 MG TABS tablet Take 1 tablet (5 mg total) by mouth 2 (two) times daily. 60 tablet 1  . aspirin EC 81 MG tablet Take 81 mg by mouth daily.    Marland Kitchen atorvastatin (LIPITOR) 80 MG tablet Take 1 tablet (80 mg total) by mouth daily at 6 PM. 90 tablet 1  . esomeprazole (NEXIUM) 40 MG capsule Take 40 mg by mouth daily.     . isosorbide mononitrate (IMDUR) 30 MG 24 hr tablet Take 0.5 tablets (15 mg total) by mouth daily. 15 tablet 0  . lisinopril (ZESTRIL) 5 MG tablet Take 5 mg by mouth daily.    . metoprolol succinate (TOPROL-XL) 25 MG 24 hr tablet Take 25 mg by mouth daily.    . Multiple Vitamin (MULTI-VITAMIN DAILY PO) Take 1 tablet by mouth daily.    Marland Kitchen PARoxetine (PAXIL) 10 MG tablet Take 10 mg by mouth daily.    Marland Kitchen doxycycline (VIBRA-TABS) 100 MG tablet Take 1 tablet (100 mg total) by mouth 2 (two) times daily. 14 tablet 0   No current facility-administered medications for this visit.    Physical Exam:  BP 130/75   Pulse 72   Temp 97.8 F (36.6 C) (Skin)   Resp 20   Ht 6' (1.829 m)   Wt 221 lb (100.2 kg)   SpO2 98% Comment: RA  BMI 29.97 kg/m   Gen: no apparent distress Heart: RRR Lungs: CTA bilaterally Incision: 3 cm  area along the mid portion that is red with mild purulence noted.  It is painful to touch.  A/P:  1. Superficial sternal cellulitis-- I suspect this may be related to an ingrown hair follice vs maybe suture reaction 2. Culture was obtained and sent 3. Gross superficial debridement was performed 4. Gave RX for Doxycycline 100 mg BID, due to allergy to Keflex, Bactrim 5. RTC in 1 week for wound check, patient was given instructions for wound care, and to contact our office should he develop fever or worsening purulence   Ellwood Handler, PA-C [facility] [phone]

## 2019-07-09 LAB — WOUND CULTURE
MICRO NUMBER:: 1185067
RESULT:: NO GROWTH
SPECIMEN QUALITY:: ADEQUATE

## 2019-07-10 ENCOUNTER — Telehealth: Payer: Self-pay

## 2019-07-10 NOTE — Telephone Encounter (Signed)
Mr Glasheen was unable to come into office today to be checked. He felt like this was something he has had in the past with ABX's.  Recommend benadryl 50 mg po a hs x1 and Hydrocortisone cream to area has needed. He will f/u with Dr Roxan Hockey on Thursday.

## 2019-07-10 NOTE — Telephone Encounter (Signed)
-----   Message from Freddrick March, PA-C sent at 07/10/2019 11:02 AM EST ----- Regarding: RE: ABX reaction Hi Otho Bellows patient who did not answer and has a full mailbox.  His culture shows no bacterial growth.  I suspect this is likely a yeast infection and not a drug reaction as it has not spread everywhere.  He can stop the Doxycycline and see if he can come in to the office today at 230 or 3 so we can evaluate his rash.  Thanks,  Junie Panning  ----- Message ----- From: Marylen Ponto, LPN Sent: [id number]/08/4095   9:43 AM EST To: Freddrick March, PA-C Subject: Linton Ham reaction                                   Mr Eley called this AM c/o "skin eruptions" rash/hives/redness, in his genital area after taking 4 days of the Doxy. He said that was typical for him with this ABX.  Would you like to put him on something else? Did u see the CX results yet? He is scheduled to see [facility] on 07/13/19 Please advise Linden Dolin

## 2019-07-11 ENCOUNTER — Other Ambulatory Visit: Payer: Self-pay | Admitting: Physician Assistant

## 2019-07-13 ENCOUNTER — Ambulatory Visit (INDEPENDENT_AMBULATORY_CARE_PROVIDER_SITE_OTHER): Payer: Self-pay | Admitting: Thoracic Surgery (Cardiothoracic Vascular Surgery)

## 2019-07-13 ENCOUNTER — Other Ambulatory Visit: Payer: Self-pay

## 2019-07-13 VITALS — BP 139/76 | HR 65 | Temp 97.8°F | Resp 20 | Ht 72.0 in | Wt 221.0 lb

## 2019-07-13 DIAGNOSIS — L03313 Cellulitis of chest wall: Secondary | ICD-10-CM

## 2019-07-13 DIAGNOSIS — Z9889 Other specified postprocedural states: Secondary | ICD-10-CM

## 2019-07-13 DIAGNOSIS — Z951 Presence of aortocoronary bypass graft: Secondary | ICD-10-CM

## 2019-07-13 NOTE — Progress Notes (Signed)
[address]       [place] [id number]             [phone]     HPI: Donald Hendrix returns for follow-up regarding his sternal wound incision  Donald Hendrix is a 68 year old Hendrix with multiple cardiac risk factors.  He presented with exertional chest tightness and had a positive stress Myoview.  Catheterization revealed left main and three-vessel disease.  Intraoperatively a TEE showed moderate mitral regurgitation with a flail P3 segment of the posterior leaflet due to a ruptured chordae tendonae.  I did coronary bypass grafting x4 and mitral repair on 05/12/2019.  He had some atrial fibrillation postoperatively and went home on amiodarone and Eliquis.  I saw him back in the office on 06/13/2019.  He was doing well at that point in time.  He came into the office on 07/06/2019 after noticing some drainage from his sternal incision.  He noted there was some redness around the incision and had some mild pus drainage.  He was seen by Donald Hendrix who did a superficial debridement and then gave him a prescription for doxycycline.  Cultures were no growth.  After being on the doxycycline for about 4 days he notices some hives and redness in his genital area.  The doxycycline was discontinued and that is improving.  Past Medical History:  Diagnosis Date  . Abnormal weight gain   . Arthritis   . Coronary artery disease   . GERD (gastroesophageal reflux disease)   . Head injury, closed, with concussion   . Heart murmur   . High risk medication use   . Hypercholesterolemia   . Hyperlipidemia, mixed   . Malignant hypertension   . MRSA (methicillin resistant Staphylococcus aureus) colonization 09/09/2018   positive PCR  . Vitamin D deficiency     Current Outpatient Medications  Medication Sig Dispense Refill  . acetaminophen (TYLENOL) 500 MG tablet Take 2 tablets (1,000 mg total) by mouth every 6 (six) hours. 30 tablet 0  . amiodarone (PACERONE) 200 MG tablet Please take  400mg  (2 tabs) twice a day for 2 days then take 200mg  (1 tab) twice a day until we see you in follow-up. 70 tablet 1  . apixaban (ELIQUIS) 5 MG TABS tablet Take 1 tablet (5 mg total) by mouth 2 (two) times daily. 60 tablet 1  . aspirin EC 81 MG tablet Take 81 mg by mouth daily.    atorvastatin (LIPITOR) 80 MG tablet Take 1 tablet (80 mg total) by mouth daily at 6 PM. 90 tablet 1  . esomeprazole (NEXIUM) 40 MG capsule Take 40 mg by mouth daily.     . isosorbide mononitrate (IMDUR) 30 MG 24 hr tablet Take 0.5 tablets (15 mg total) by mouth daily. 15 tablet 0  . lisinopril (ZESTRIL) 5 MG tablet Take 5 mg by mouth daily.    . metoprolol succinate (TOPROL-XL) 25 MG 24 hr tablet Take 25 mg by mouth daily.    . Multiple Vitamin (MULTI-VITAMIN DAILY PO) Take 1 tablet by mouth daily.    PARoxetine (PAXIL) 10 MG tablet Take 10 mg by mouth daily.     No current facility-administered medications for this visit.    Physical Exam BP 139/76   Pulse 65   Temp 97.8 F (36.6 C) (Skin)   Resp 20   Ht 6' (1.829 m)   Wt 221 lb (100.2 kg)   SpO2 97% Comment: RA  BMI 29.97  kg/m  Donald Hendrix in no acute distress Alert and oriented x3 with no focal deficits Approximately 8 mm area of skin incision open with hypertrophic granulation tissue.  Exploration revealed a small fragment of suture.  Cauterized with silver nitrate.  Impression: Donald Hendrix is a Donald Hendrix who is about 2 months out from coronary bypass grafting and mitral valve repair.  He developed a superficial localized wound infection versus suture granuloma.  There is no evidence of ongoing infection.  I cauterized the area with silver nitrate.  Should heal up fine, but if he has any problems with that he can give [place] a call and I will be happy to see him at any time.  I do not think he needs any additional antibiotics there is no evidence of cellulitis or deeper infection.   Plan: Keep covered with dry gauze.  May apply peroxide  if exudate noted. Call if there is any further problems related to the incision or the groin rash related to the antibiotics.  [name], MD [facility] [phone]

## 2019-07-14 ENCOUNTER — Other Ambulatory Visit: Payer: Self-pay | Admitting: Physician Assistant

## 2019-07-20 ENCOUNTER — Other Ambulatory Visit: Payer: Self-pay | Admitting: Physician Assistant

## 2019-08-18 ENCOUNTER — Other Ambulatory Visit: Payer: Self-pay | Admitting: Physician Assistant

## 2019-08-22 ENCOUNTER — Other Ambulatory Visit: Payer: Self-pay | Admitting: Physician Assistant

## 2019-10-06 ENCOUNTER — Other Ambulatory Visit: Payer: Self-pay | Admitting: Cardiovascular Disease

## 2019-10-22 ENCOUNTER — Other Ambulatory Visit: Payer: Self-pay | Admitting: Cardiology

## 2020-02-25 DEATH — deceased

## 2020-04-19 ENCOUNTER — Other Ambulatory Visit: Payer: Self-pay | Admitting: Cardiology

## 2020-08-07 IMAGING — DX DG CHEST 1V PORT
1 series · 1 of 1 positions shown · non-contrast
Comparison: 05/09/2019

CLINICAL DATA: Status post CABG and mitral valve replacement.

EXAM:
PORTABLE CHEST 1 VIEW

[chest]
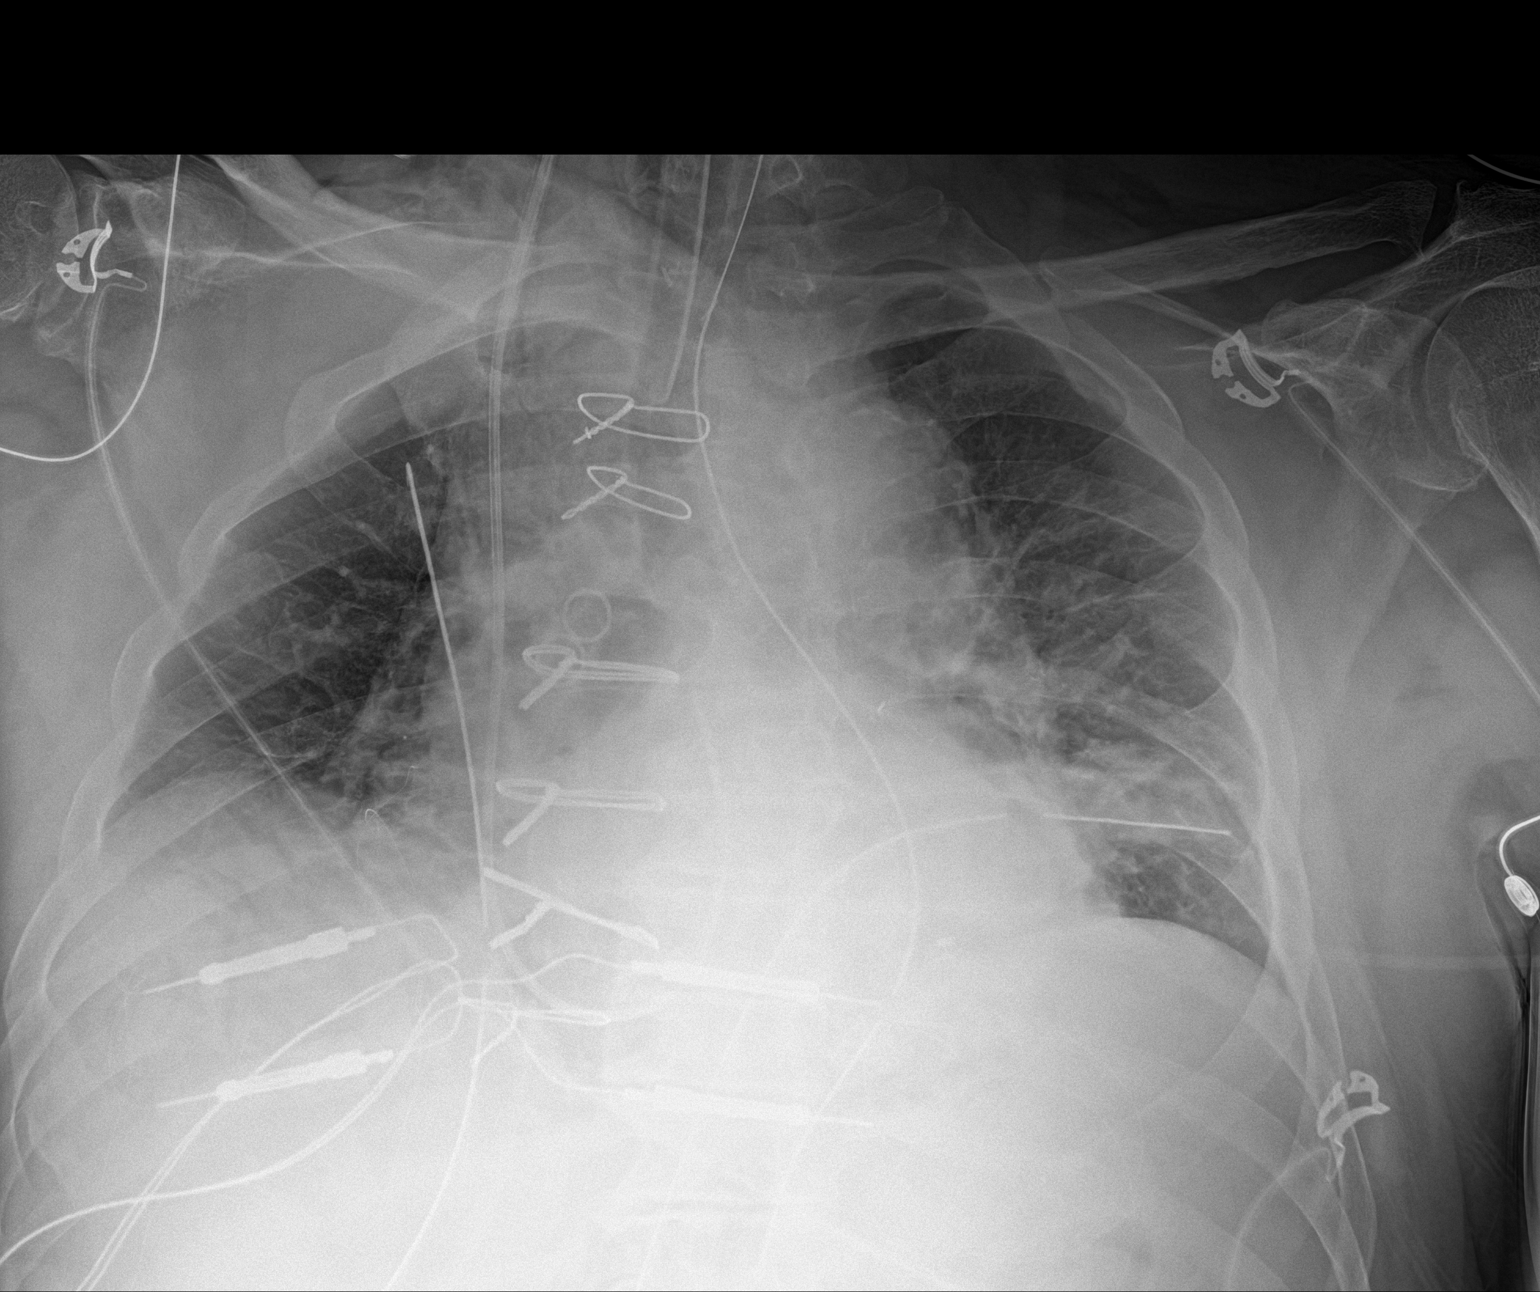

[1 of 1 positions shown; findings below may reference images not displayed]

FINDINGS: Endotracheal tube is in good position 5 cm above the carina. NG tube
tip below the diaphragm. Swan-Ganz catheter tip in the main
pulmonary artery. Chest tubes in place. No pneumothorax.

Cardiac silhouette is normal. Pulmonary vascularity is normal.
Minimal atelectasis on the left. No effusions.
IMPRESSION: Satisfactory postoperative appearance of the chest. No pneumothorax.
Minimal atelectasis on the left.
